# Patient Record
Sex: Male | Born: 1955 | Race: Black or African American | Hispanic: No | Smoking: Former smoker
Health system: Southern US, Community
[De-identification: ages and names within clinical notes are randomized; demographics above are authoritative.]

## PROBLEM LIST (undated history)

## (undated) DIAGNOSIS — Z9189 Other specified personal risk factors, not elsewhere classified: Secondary | ICD-10-CM

## (undated) DIAGNOSIS — F209 Schizophrenia, unspecified: Secondary | ICD-10-CM

## (undated) DIAGNOSIS — H53001 Unspecified amblyopia, right eye: Secondary | ICD-10-CM

## (undated) DIAGNOSIS — J449 Chronic obstructive pulmonary disease, unspecified: Secondary | ICD-10-CM

## (undated) DIAGNOSIS — F410 Panic disorder [episodic paroxysmal anxiety] without agoraphobia: Secondary | ICD-10-CM

## (undated) DIAGNOSIS — H5440 Blindness, one eye, unspecified eye: Secondary | ICD-10-CM

## (undated) HISTORY — PX: TRACHEOSTOMY: SUR1362

## (undated) HISTORY — PX: NECK SURGERY: SHX720

## (undated) HISTORY — DX: Unspecified amblyopia, right eye: H53.001

## (undated) HISTORY — DX: Blindness, one eye, unspecified eye: H54.40

## (undated) HISTORY — PX: EXPLORATORY LAPAROTOMY: SUR591

## (undated) HISTORY — DX: Other specified personal risk factors, not elsewhere classified: Z91.89

---

## 2007-09-10 ENCOUNTER — Encounter: Payer: Self-pay | Admitting: Gastroenterology

## 2007-09-10 ENCOUNTER — Ambulatory Visit: Payer: Self-pay | Admitting: Gastroenterology

## 2007-09-10 ENCOUNTER — Ambulatory Visit (HOSPITAL_COMMUNITY): Admission: RE | Admit: 2007-09-10 | Discharge: 2007-09-10 | Payer: Self-pay | Admitting: Gastroenterology

## 2008-11-12 ENCOUNTER — Ambulatory Visit (HOSPITAL_COMMUNITY): Admission: RE | Admit: 2008-11-12 | Discharge: 2008-11-12 | Payer: Self-pay | Admitting: Internal Medicine

## 2010-06-06 NOTE — Op Note (Signed)
NAME:  Terry [name], Terry [name]                ACCOUNT NO.:  [id number]   MEDICAL RECORD NO.:  [id number]          PATIENT TYPE:  AMB   LOCATION:  DAY                           FACILITY:  [facility]   PHYSICIAN:  Kassie Mends, M.D.      DATE OF BIRTH:  October 10, 1955   DATE OF PROCEDURE:  DATE OF DISCHARGE:                               OPERATIVE REPORT   PRIMARY CARE PHYSICIAN:  Tesfaye D. Felecia Shelling, MD   PROCEDURE:  Colonoscopy with cold forceps polypectomy.   INDICATION FOR EXAM:  Terry [name] is a 55 year old male who presents for  average risk colon cancer screening.   FINDINGS:  1. A 1-mm sessile transverse colon polyp removed via cold forceps.  2. Ascending colon, descending colon, and sigmoid colon      diverticulosis.  Most predominant in the descending and sigmoid      colon.  3. Otherwise, no masses, inflammatory changes, or AVMs seen.  4. Normal retroflexed view of the rectum.   RECOMMENDATIONS:  1. Screening colonoscopy in 10 years.  2. Will call Terry [name] with the results of his biopsies.  3. He should follow a high-fiber diet.  He is given a handout on high-      fiber diet and polyps.  4. No aspirin, NSAIDs, or anticoagulation for 5 days.   MEDICATIONS:  1. Demerol 100 mg IV.  2. Versed 6 mg IV.   PROCEDURE TECHNIQUE:  Physical exam was performed.  Informed consent was  obtained from the patient after explaining the benefits, risks, and  alternatives of the procedure.  The patient was connected to the monitor  and placed in the left lateral position.  Continuous oxygen was provided  by nasal cannula.  IV medicine was administered through an indwelling  cannula.  After administration of sedation and rectal exam, the  patient's rectum was intubated and the scope was advanced under direct  visualization to the cecum.  The scope was removed slowly  by carefully examining the color, texture, anatomy, and integrity of the  mucosa on the way out.  The patient was recovered in endoscopy  and  discharged home in satisfactory condition.   PATH:  Simple adenoma. TCS in 10 years. High Fiber diet.      Kassie Mends, M.D.  Electronically Signed     SM/MEDQ  D:  09/10/2007  T:  09/11/2007  Job:  [id number]   cc:   Tesfaye D. Felecia Shelling, MD  Fax: [phone]

## 2012-06-23 ENCOUNTER — Inpatient Hospital Stay (HOSPITAL_COMMUNITY)
Admission: EM | Admit: 2012-06-23 | Discharge: 2012-06-26 | DRG: 189 | Disposition: A | Discharge status: Home / Self Care | Payer: PRIVATE HEALTH INSURANCE | Attending: Internal Medicine | Admitting: Internal Medicine

## 2012-06-23 ENCOUNTER — Emergency Department (HOSPITAL_COMMUNITY): Payer: PRIVATE HEALTH INSURANCE

## 2012-06-23 ENCOUNTER — Encounter (HOSPITAL_COMMUNITY): Payer: Self-pay | Admitting: *Deleted

## 2012-06-23 DIAGNOSIS — E669 Obesity, unspecified: Secondary | ICD-10-CM | POA: Diagnosis present

## 2012-06-23 DIAGNOSIS — A419 Sepsis, unspecified organism: Secondary | ICD-10-CM

## 2012-06-23 DIAGNOSIS — F209 Schizophrenia, unspecified: Secondary | ICD-10-CM | POA: Diagnosis present

## 2012-06-23 DIAGNOSIS — R7309 Other abnormal glucose: Secondary | ICD-10-CM | POA: Diagnosis present

## 2012-06-23 DIAGNOSIS — Z87891 Personal history of nicotine dependence: Secondary | ICD-10-CM

## 2012-06-23 DIAGNOSIS — F319 Bipolar disorder, unspecified: Secondary | ICD-10-CM | POA: Diagnosis present

## 2012-06-23 DIAGNOSIS — Z833 Family history of diabetes mellitus: Secondary | ICD-10-CM

## 2012-06-23 DIAGNOSIS — E860 Dehydration: Secondary | ICD-10-CM | POA: Diagnosis present

## 2012-06-23 DIAGNOSIS — J441 Chronic obstructive pulmonary disease with (acute) exacerbation: Secondary | ICD-10-CM | POA: Diagnosis present

## 2012-06-23 DIAGNOSIS — Z6841 Body Mass Index (BMI) 40.0 and over, adult: Secondary | ICD-10-CM

## 2012-06-23 DIAGNOSIS — J9600 Acute respiratory failure, unspecified whether with hypoxia or hypercapnia: Principal | ICD-10-CM | POA: Diagnosis present

## 2012-06-23 DIAGNOSIS — E872 Acidosis, unspecified: Secondary | ICD-10-CM | POA: Diagnosis present

## 2012-06-23 HISTORY — DX: Schizophrenia, unspecified: F20.9

## 2012-06-23 LAB — HEPATIC FUNCTION PANEL
ALT: 38 U/L (ref 0–53)
AST: 38 U/L — ABNORMAL HIGH (ref 0–37)
Albumin: 3.2 g/dL — ABNORMAL LOW (ref 3.5–5.2)
Bilirubin, Direct: <0.1 mg/dL (ref 0.0–0.3)
Total Bilirubin: 0.4 mg/dL (ref 0.3–1.2)

## 2012-06-23 LAB — RAPID URINE DRUG SCREEN, HOSP PERFORMED
Amphetamines: NOT DETECTED
Barbiturates: NOT DETECTED
Cocaine: NOT DETECTED
Tetrahydrocannabinol: NOT DETECTED

## 2012-06-23 LAB — CBC WITH DIFFERENTIAL/PLATELET
Eosinophils Absolute: 0.3 10*3/uL (ref 0.0–0.7)
Lymphocytes Relative: 44 % (ref 12–46)
MCHC: 33.1 g/dL (ref 30.0–36.0)
Platelets: 187 10*3/uL (ref 150–400)
RDW: 15.1 % (ref 11.5–15.5)
WBC: 5.6 10*3/uL (ref 4.0–10.5)

## 2012-06-23 LAB — BLOOD GAS, ARTERIAL
Acid-base deficit: 2.9 mmol/L — ABNORMAL HIGH (ref 0.0–2.0)
Bicarbonate: 21.7 mEq/L (ref 20.0–24.0)
O2 Content: 2 L/min
O2 Saturation: 97 %
Patient temperature: 37

## 2012-06-23 LAB — URINALYSIS, ROUTINE W REFLEX MICROSCOPIC
Ketones, ur: NEGATIVE mg/dL
Leukocytes, UA: NEGATIVE
Protein, ur: 100 mg/dL — AB
Specific Gravity, Urine: >1.03 — ABNORMAL HIGH (ref 1.005–1.030)
Urobilinogen, UA: 0.2 mg/dL (ref 0.0–1.0)

## 2012-06-23 LAB — LACTIC ACID, PLASMA: Lactic Acid, Venous: 6.2 mmol/L — ABNORMAL HIGH (ref 0.5–2.2)

## 2012-06-23 LAB — GLUCOSE, CAPILLARY: Glucose-Capillary: 164 mg/dL — ABNORMAL HIGH (ref 70–99)

## 2012-06-23 LAB — BASIC METABOLIC PANEL
BUN: 12 mg/dL (ref 6–23)
CO2: 20 mEq/L (ref 19–32)
Chloride: 99 mEq/L (ref 96–112)
GFR calc non Af Amer: 55 mL/min — ABNORMAL LOW (ref >90)
Potassium: 4 mEq/L (ref 3.5–5.1)

## 2012-06-23 LAB — URINE MICROSCOPIC-ADD ON

## 2012-06-23 MED ORDER — SODIUM CHLORIDE 0.9 % IJ SOLN
3.0000 mL | Freq: Two times a day (BID) | INTRAMUSCULAR | Status: DC
  Administered 2012-06-23 – 2012-06-25 (×6): 3 mL via INTRAVENOUS

## 2012-06-23 MED ORDER — ACETAMINOPHEN 325 MG PO TABS: 650.0000 mg | ORAL_TABLET | ORAL | Status: DC | PRN

## 2012-06-23 MED ORDER — ZIPRASIDONE HCL 80 MG PO CAPS
80.0000 mg | ORAL_CAPSULE | Freq: Every day | ORAL | Status: DC
  Administered 2012-06-23 – 2012-06-25 (×3): 80 mg via ORAL
  Filled 2012-06-23 (×4): qty 1

## 2012-06-23 MED ORDER — SODIUM CHLORIDE 0.9 % IV SOLN
Freq: Once | INTRAVENOUS | Status: AC
  Administered 2012-06-23: 1000 mL via INTRAVENOUS

## 2012-06-23 MED ORDER — BISACODYL 10 MG RE SUPP: 10.0000 mg | Freq: Every day | RECTAL | Status: DC | PRN

## 2012-06-23 MED ORDER — PIPERACILLIN-TAZOBACTAM 3.375 G IVPB
3.3750 g | Freq: Once | INTRAVENOUS | Status: AC
  Administered 2012-06-23: 3.375 g via INTRAVENOUS
  Filled 2012-06-23: qty 50

## 2012-06-23 MED ORDER — BENZTROPINE MESYLATE 1 MG PO TABS
1.0000 mg | ORAL_TABLET | Freq: Every day | ORAL | Status: DC
  Administered 2012-06-23 – 2012-06-25 (×3): 1 mg via ORAL
  Filled 2012-06-23 (×3): qty 1

## 2012-06-23 MED ORDER — ASPIRIN EC 81 MG PO TBEC
81.0000 mg | DELAYED_RELEASE_TABLET | Freq: Every day | ORAL | Status: DC
  Administered 2012-06-23 – 2012-06-26 (×4): 81 mg via ORAL
  Filled 2012-06-23 (×6): qty 1

## 2012-06-23 MED ORDER — POTASSIUM CHLORIDE IN NACL 20-0.9 MEQ/L-% IV SOLN
INTRAVENOUS | Status: DC
  Administered 2012-06-23 – 2012-06-26 (×7): via INTRAVENOUS

## 2012-06-23 MED ORDER — SODIUM CHLORIDE 0.9 % IV BOLUS (SEPSIS)
1000.0000 mL | Freq: Once | INTRAVENOUS | Status: AC
  Administered 2012-06-23: 1000 mL via INTRAVENOUS

## 2012-06-23 MED ORDER — SODIUM CHLORIDE 0.9 % IV BOLUS (SEPSIS): 1000.0000 mL | Freq: Once | INTRAVENOUS | Status: DC

## 2012-06-23 MED ORDER — ALBUTEROL SULFATE (5 MG/ML) 0.5% IN NEBU
5.0000 mg | INHALATION_SOLUTION | Freq: Once | RESPIRATORY_TRACT | Status: AC
  Administered 2012-06-23: 5 mg via RESPIRATORY_TRACT
  Filled 2012-06-23: qty 1

## 2012-06-23 MED ORDER — GUAIFENESIN ER 600 MG PO TB12
1200.0000 mg | ORAL_TABLET | Freq: Two times a day (BID) | ORAL | Status: DC
  Administered 2012-06-23 – 2012-06-26 (×7): 1200 mg via ORAL
  Filled 2012-06-23 (×10): qty 2

## 2012-06-23 MED ORDER — CHLORHEXIDINE GLUCONATE CLOTH 2 % EX PADS: 6.0000 | MEDICATED_PAD | Freq: Once | CUTANEOUS | Status: DC

## 2012-06-23 MED ORDER — IPRATROPIUM BROMIDE 0.02 % IN SOLN
0.5000 mg | RESPIRATORY_TRACT | Status: DC
  Administered 2012-06-23 – 2012-06-26 (×15): 0.5 mg via RESPIRATORY_TRACT
  Filled 2012-06-23 (×15): qty 2.5

## 2012-06-23 MED ORDER — FLEET ENEMA 7-19 GM/118ML RE ENEM: 1.0000 | ENEMA | Freq: Once | RECTAL | Status: AC | PRN

## 2012-06-23 MED ORDER — ENOXAPARIN SODIUM 40 MG/0.4ML ~~LOC~~ SOLN
40.0000 mg | SUBCUTANEOUS | Status: DC
  Administered 2012-06-24 – 2012-06-26 (×3): 40 mg via SUBCUTANEOUS
  Filled 2012-06-23 (×3): qty 0.4

## 2012-06-23 MED ORDER — ALBUTEROL SULFATE (5 MG/ML) 0.5% IN NEBU
2.5000 mg | INHALATION_SOLUTION | RESPIRATORY_TRACT | Status: DC
  Administered 2012-06-23 – 2012-06-26 (×15): 2.5 mg via RESPIRATORY_TRACT
  Filled 2012-06-23 (×15): qty 0.5

## 2012-06-23 MED ORDER — LEVOFLOXACIN IN D5W 750 MG/150ML IV SOLN
750.0000 mg | INTRAVENOUS | Status: AC
  Administered 2012-06-24 – 2012-06-25 (×2): 750 mg via INTRAVENOUS
  Filled 2012-06-23 (×2): qty 150

## 2012-06-23 MED ORDER — INSULIN ASPART 100 UNIT/ML ~~LOC~~ SOLN: 0.0000 [IU] | Freq: Every day | SUBCUTANEOUS | Status: DC

## 2012-06-23 MED ORDER — ALBUTEROL SULFATE (5 MG/ML) 0.5% IN NEBU: 2.5000 mg | INHALATION_SOLUTION | RESPIRATORY_TRACT | Status: DC | PRN

## 2012-06-23 MED ORDER — METHYLPREDNISOLONE SODIUM SUCC 125 MG IJ SOLR
125.0000 mg | Freq: Four times a day (QID) | INTRAMUSCULAR | Status: DC
  Administered 2012-06-23 – 2012-06-25 (×8): 125 mg via INTRAVENOUS
  Filled 2012-06-23 (×8): qty 2

## 2012-06-23 MED ORDER — INSULIN ASPART 100 UNIT/ML ~~LOC~~ SOLN
0.0000 [IU] | Freq: Three times a day (TID) | SUBCUTANEOUS | Status: DC
  Administered 2012-06-23 – 2012-06-24 (×5): 3 [IU] via SUBCUTANEOUS
  Administered 2012-06-25 (×2): 2 [IU] via SUBCUTANEOUS
  Administered 2012-06-25: 3 [IU] via SUBCUTANEOUS

## 2012-06-23 MED ORDER — IPRATROPIUM BROMIDE 0.02 % IN SOLN
0.5000 mg | Freq: Once | RESPIRATORY_TRACT | Status: AC
  Administered 2012-06-23: 0.5 mg via RESPIRATORY_TRACT
  Filled 2012-06-23: qty 2.5

## 2012-06-23 MED ORDER — ONDANSETRON HCL 4 MG/2ML IJ SOLN: 4.0000 mg | INTRAMUSCULAR | Status: DC | PRN

## 2012-06-23 NOTE — Progress Notes (Signed)
Inpatient Diabetes Program Recommendations  AACE/ADA: New Consensus Statement on Inpatient Glycemic Control (2013)  Target Ranges:  Prepandial:   less than 140 mg/dL      Peak postprandial:   less than 180 mg/dL (1-2 hours)      Critically ill patients:  140 - 180 mg/dL   Results for Terry [name], Terry [name] (MRN [id number]) as of 06/23/2012 07:36  Ref. Range 06/23/2012 03:05  Glucose Latest Range: 70-99 mg/dL 782 (H)    Inpatient Diabetes Program Recommendations Correction (SSI): Please consider ordering CBGs with Novolog correction ACHS. HgbA1C: Please consider ordering an A1C to determing glycemic control over the past 2-3 months. Diet: May want to consider changing diet to carb modified diabetic diet.  Note: Patient does not have a documented history of diabetes.  However, initial lab glucose was 223 mg/dl at 9:56 am on 02/23/28.  Please consider ordering an A1C, CBGs with Novolog correction, and changing diet to carb modified.  Will continue to follow.  Thanks, Orlando Penner, RN, MSN, CCRN Diabetes Coordinator Inpatient Diabetes Program [phone]

## 2012-06-23 NOTE — Progress Notes (Signed)
Brief initial visit offering emotional/spiritual support.  Patient expressed he had his faith support contact.

## 2012-06-23 NOTE — H&P (Signed)
[facility] History and Physical  Terry [name]  ZOX:[id number]  DOB: 1955/07/19   DOA: 06/23/2012   PCP:   Avon Gully, MD   Chief Complaint:  Respiratory distress  HPI: Terry [name] is an 57 y.o. male.   Obese middle-aged [place] American gentleman with a history of schizophrenia and COPD, reports that she's been having increasing shortness of breath for the past month which she has been treating using his home nebulizer. During the other about attack today he kept using his nebulizer repeatedly without any effect and slices are become fatigued and confused; he said he felt as though his nebulizer was not working and he wondered whether the medication was expired. Eventually his mother called EMS who found him barely responsive with an O2 sats in the 50s. They gave him nebulization which brought it into the 90s and he was transported to the emergency room.  In the emergency room patient was noted to be dehydrated, hypotensive with blood pressure in the 80s, and was noticed to have a lactic acid of 6.2. The hospitalist service was called to assess.  He is has no fever, no worsening cough, no frequency or dysuria.  Rewiew of Systems:   All systems negative except as marked bold or noted in the HPI;  Constitutional:    malaise, fever and chills. ;  Eyes:   eye pain, redness and discharge. ;  ENMT:   ear pain, hoarseness, nasal congestion, sinus pressure and sore throat. ;  Cardiovascular:    chest pain, palpitations, diaphoresis, dyspnea and peripheral edema.  Respiratory:   cough, hemoptysis, wheezing and stridor. ;  Gastrointestinal:  nausea, vomiting, diarrhea, constipation, abdominal pain, melena, blood in stool, hematemesis, jaundice and rectal bleeding. unusual weight loss..   Genitourinary:    frequency, dysuria, incontinence,flank pain and hematuria; Musculoskeletal:   back pain and neck pain.  swelling and trauma.;  Skin: .  pruritus, rash, abrasions, bruising and skin  lesion.; ulcerations Neuro:    headache, lightheadedness and neck stiffness.  weakness, altered level of consciousness, altered mental status, extremity weakness, burning feet, involuntary movement, seizure and syncope.  Psych:    anxiety, depression, insomnia, tearfulness, panic attacks, hallucinations, paranoia, suicidal or homicidal ideation    Past Medical History  Diagnosis Date  . Bronchitis   . Bipolar disorder     Past Surgical History  Procedure Laterality Date  . Neck surgery      Medications:  HOME MEDS: Prior to Admission medications   Medication Sig Start Date End Date Taking? Authorizing Provider  benztropine (COGENTIN) 1 MG tablet Take 1 mg by mouth at bedtime.   Yes Historical Provider, MD  ziprasidone (GEODON) 80 MG capsule Take 80 mg by mouth at bedtime.   Yes Historical Provider, MD     Allergies:  No Known Allergies  Social History:   reports that he has quit smoking. He does not have any smokeless tobacco history on file. He reports that he does not drink alcohol or use illicit drugs.  Family History: His father was diabetic  Physical Exam: Filed Vitals:   06/23/12 0341 06/23/12 0406 06/23/12 0500 06/23/12 0600  BP: 80/49 92/48 90/55  100/52  Pulse: 105 98 90 94  Temp:      TempSrc:      Resp:  22 18 21   Height:      Weight:      SpO2: 95% 94%     Blood pressure 100/52, pulse 94, temperature 98.3 F (36.8 C), temperature source  Oral, resp. rate 21, height 5\' 8"  (1.727 m), weight 120.203 kg (265 lb), SpO2 94.00%.  GEN:  Pleasant obese middle-aged African American gentleman reclining in the stretcher in no acute distress; cooperative with exam PSYCH:  alert and oriented x4;  neither anxious nor depressed; affect is appropriate. HEENT: Mucous membranes pink and anicteric; PERRLA; EOM intact; no cervical lymphadenopathy nor thyromegaly or carotid bruit; no JVD; thick neck Breasts:: Not examined CHEST WALL: No tenderness CHEST: Tachypneic, diffuse  mild bilateral wheezing HEART: Regular rate and rhythm; no murmurs rubs or gallops BACK: No kyphosis no scoliosis; no CVA tenderness ABDOMEN: Obese, firm  non-tender; no masses, no organomegaly, normal abdominal bowel sounds;  Rectal Exam: Not done EXTREMITIES: age-appropriate arthropathy of the hands and knees; no edema; no ulcerations. Genitalia: not examined PULSES: 2+ and symmetric SKIN: Normal hydration no rash or ulceration CNS: Cranial nerves 2-12 grossly intact no focal lateralizing neurologic deficit   Labs on Admission:  Basic Metabolic Panel:  Recent Labs Lab 06/23/12 0305  NA 136  K 4.0  CL 99  CO2 20  GLUCOSE 223*  BUN 12  CREATININE 1.40*  CALCIUM 8.8   Liver Function Tests: No results found for this basename: AST, ALT, ALKPHOS, BILITOT, PROT, ALBUMIN,  in the last 168 hours No results found for this basename: LIPASE, AMYLASE,  in the last 168 hours No results found for this basename: AMMONIA,  in the last 168 hours CBC:  Recent Labs Lab 06/23/12 0305  WBC 5.6  NEUTROABS 2.5  HGB 14.8  HCT 44.7  MCV 81.7  PLT 187   Cardiac Enzymes: No results found for this basename: CKTOTAL, CKMB, CKMBINDEX, TROPONINI,  in the last 168 hours BNP: No components found with this basename: POCBNP,  D-dimer: No components found with this basename: D-DIMER,  CBG: No results found for this basename: GLUCAP,  in the last 168 hours  Radiological Exams on Admission: Dg Chest Portable 1 View  06/23/2012   *RADIOLOGY REPORT*  Clinical Data: Shortness of breath; history of asthma.  PORTABLE CHEST - 1 VIEW  Comparison: Chest radiograph performed 11/12/2008  Findings: The lungs are well-aerated and clear.  There is no evidence of focal opacification, pleural effusion or pneumothorax.  The cardiomediastinal silhouette is within normal limits.  No acute osseous abnormalities are seen.  IMPRESSION: No acute cardiopulmonary process seen.   Original Report Authenticated By: Tonia Ghent, M.D.       Assessment/Plan  Principal Problem:   Lactic acidosis Active Problems:   Dehydration   COPD with acute exacerbation   Schizophrenia, well controlled  hyperglycemia possible diabetic   PLAN: Lactic acidosis likely due to the excess work of breathing associated with hypoxia; unlikely to be due to sepsis. Nevertheless we'll admit him to the step down unit for hydration and monitoring of his vital signs in case this is an early sepsis.  Give steroids nebulizations IV fluids and a short course of antibiotics for COPD exacerbation  We'll check his hemoglobin A1c place him on a diabetic diet and give sliding scale insulin. If hemoglobin A1c is elevated we'll need treatment for his diabetes including weight loss  Continue Geodon and Cogentin for tonic schizophrenia  Other plans as per orders.  Code Status: Full code Family Communication: Plans discussed with patient Disposition Plan: Eventually back home  Critical care time: 60 minutes.   Terry [name] Nocturnist [facility] Pager [phone]   06/23/2012, 6:15 AM

## 2012-06-23 NOTE — ED Notes (Signed)
Pt states he got were he could not breath, pt was trying to use his neb machine, but did not help. Pt thinks medicine is old saying it is a least a year old.

## 2012-06-23 NOTE — Progress Notes (Signed)
Utilization Review Complete  

## 2012-06-23 NOTE — ED Notes (Signed)
EMS called out for unresponsive, upon arrival pt sats in the 50's, pt trying to take breathing tx. EMS gave breathing tx, pt now alert & answering questions pt states could not breath right, started while working on computer.

## 2012-06-23 NOTE — ED Provider Notes (Signed)
History     CSN: [id number]  Arrival date & time 06/23/12  0246   First MD Initiated Contact with Patient 06/23/12 0257      Chief Complaint  Patient presents with  . Shortness of Breath    (Consider location/radiation/quality/duration/timing/severity/associated sxs/prior treatment) HPI HPI Comments: Terry [name] is a 57 y.o. male brought in by ambulance, who presents to the Emergency Department complaining of shortness of breath. When found by EMS he was on a nebulizer, O2 sats were in the 50s, he was barely responsive. They gave him a breathing treatment with revival of the patient and O2 sats in the 90s. He states he was working on the computer and got short of breath. He went to use his nebulizer and that the last thing he remembers clearly until EMS was there. His wife says he was barely responsive on the nebulizer when she called EMS.   PCP Dr. Felecia Shelling Past Medical History  Diagnosis Date  . Bronchitis   . Bipolar disorder     Past Surgical History  Procedure Laterality Date  . Neck surgery      No family history on file.  History  Substance Use Topics  . Smoking status: Former Games developer  . Smokeless tobacco: Not on file  . Alcohol Use: No      Review of Systems  Constitutional: Negative for fever.       10 Systems reviewed and are negative for acute change except as noted in the HPI.  HENT: Negative for congestion.   Eyes: Negative for discharge and redness.  Respiratory: Positive for shortness of breath. Negative for cough.   Cardiovascular: Negative for chest pain.  Gastrointestinal: Negative for vomiting and abdominal pain.  Musculoskeletal: Negative for back pain.  Skin: Negative for rash.  Neurological: Negative for syncope, numbness and headaches.  Psychiatric/Behavioral:       No behavior change.    Allergies  Review of patient's allergies indicates no known allergies.  Home Medications   Current Outpatient Rx  Name  Route  Sig  Dispense   Refill  . benztropine (COGENTIN) 1 MG tablet   Oral   Take 1 mg by mouth at bedtime.         . ziprasidone (GEODON) 80 MG capsule   Oral   Take 80 mg by mouth at bedtime.           BP 90/55  Pulse 90  Temp(Src) 98.3 F (36.8 C) (Oral)  Resp 18  Ht 5\' 8"  (1.727 m)  Wt 265 lb (120.203 kg)  BMI 40.3 kg/m2  SpO2 94%  Physical Exam  Nursing note and vitals reviewed. Constitutional: He appears well-developed and well-nourished.  Awake, alert, nontoxic appearance.  HENT:  Head: Normocephalic and atraumatic.  Right Ear: External ear normal.  Left Ear: External ear normal.  Eyes: EOM are normal. Pupils are equal, round, and reactive to light.  Neck: Normal range of motion. Neck supple.  Cardiovascular: Intact distal pulses.   tachycardia  Pulmonary/Chest: Effort normal. He exhibits no tenderness.  Wheezing, moderate air movement.  Abdominal: Soft. There is no tenderness. There is no rebound.  Musculoskeletal: He exhibits no tenderness.  Baseline ROM, no obvious new focal weakness.  Neurological:  Mental status and motor strength appears baseline for patient and situation.  Skin: No rash noted.  Psychiatric: He has a normal mood and affect.    ED Course  Procedures (including critical care time) Results for orders placed during the hospital encounter of  06/23/12  CBC WITH DIFFERENTIAL      Result Value Range   WBC 5.6  4.0 - 10.5 K/uL   RBC 5.47  4.22 - 5.81 MIL/uL   Hemoglobin 14.8  13.0 - 17.0 g/dL   HCT 16.1  09.6 - 04.5 %   MCV 81.7  78.0 - 100.0 fL   MCH 27.1  26.0 - 34.0 pg   MCHC 33.1  30.0 - 36.0 g/dL   RDW 40.9  81.1 - 91.4 %   Platelets 187  150 - 400 K/uL   Neutrophils Relative % 46  43 - 77 %   Lymphocytes Relative 44  12 - 46 %   Monocytes Relative 3  3 - 12 %   Eosinophils Relative 6 (*) 0 - 5 %   Basophils Relative 1  0 - 1 %   Neutro Abs 2.5  1.7 - 7.7 K/uL   Lymphs Abs 2.5  0.7 - 4.0 K/uL   Monocytes Absolute 0.2  0.1 - 1.0 K/uL    Eosinophils Absolute 0.3  0.0 - 0.7 K/uL   Basophils Absolute 0.1  0.0 - 0.1 K/uL   WBC Morphology ATYPICAL LYMPHOCYTES    BASIC METABOLIC PANEL      Result Value Range   Sodium 136  135 - 145 mEq/L   Potassium 4.0  3.5 - 5.1 mEq/L   Chloride 99  96 - 112 mEq/L   CO2 20  19 - 32 mEq/L   Glucose, Bld 223 (*) 70 - 99 mg/dL   BUN 12  6 - 23 mg/dL   Creatinine, Ser 7.82 (*) 0.50 - 1.35 mg/dL   Calcium 8.8  8.4 - 95.6 mg/dL   GFR calc non Af Amer 55 (*) >90 mL/min   GFR calc Af Amer 63 (*) >90 mL/min  LACTIC ACID, PLASMA      Result Value Range   Lactic Acid, Venous 6.2 (*) 0.5 - 2.2 mmol/L  URINALYSIS, ROUTINE W REFLEX MICROSCOPIC      Result Value Range   Color, Urine YELLOW  YELLOW   APPearance CLEAR  CLEAR   Specific Gravity, Urine >1.030 (*) 1.005 - 1.030   pH 5.5  5.0 - 8.0   Glucose, UA 100 (*) NEGATIVE mg/dL   Hgb urine dipstick MODERATE (*) NEGATIVE   Bilirubin Urine NEGATIVE  NEGATIVE   Ketones, ur NEGATIVE  NEGATIVE mg/dL   Protein, ur 213 (*) NEGATIVE mg/dL   Urobilinogen, UA 0.2  0.0 - 1.0 mg/dL   Nitrite NEGATIVE  NEGATIVE   Leukocytes, UA NEGATIVE  NEGATIVE  URINE RAPID DRUG SCREEN (HOSP PERFORMED)      Result Value Range   Opiates NONE DETECTED  NONE DETECTED   Cocaine NONE DETECTED  NONE DETECTED   Benzodiazepines NONE DETECTED  NONE DETECTED   Amphetamines NONE DETECTED  NONE DETECTED   Tetrahydrocannabinol NONE DETECTED  NONE DETECTED   Barbiturates NONE DETECTED  NONE DETECTED  URINE MICROSCOPIC-ADD ON      Result Value Range   RBC / HPF 3-6  <3 RBC/hpf   Casts GRANULAR CAST (*) NEGATIVE     Date: 06/23/2012   0865  Rate: 122  Rhythm: sinus tachycardia  QRS Axis: normal  Intervals: normal  ST/T Wave abnormalities: normal  Conduction Disutrbances:none  Narrative Interpretation:   Old EKG Reviewed: none available Dg Chest Portable 1 View  06/23/2012   *RADIOLOGY REPORT*  Clinical Data: Shortness of breath; history of asthma.  PORTABLE CHEST - 1 VIEW  Comparison: Chest radiograph performed 11/12/2008  Findings: The lungs are well-aerated and clear.  There is no evidence of focal opacification, pleural effusion or pneumothorax.  The cardiomediastinal silhouette is within normal limits.  No acute osseous abnormalities are seen.  IMPRESSION: No acute cardiopulmonary process seen.   Original Report Authenticated By: Tonia Ghent, M.D.   6:05 AM:  T/C to Dr. Modena Nunnery, hospitalist, case discussed, including:  HPI, pertinent PM/SHx, VS/PE, dx testing, ED course and treatment.  Asked that I call him back once UA is back.  6:09 AM:  T/C to Dr. Orvan Falconer, hospitalist. Jovita Gamma him the UA results. With urine only showing dehydration not clear the cause of elevated lactic acid. Will continue fluid resuscitation.Admit to step down to Dr. Felecia Shelling.    MDM  Patient presents with shortness of breath that was resolving as he arrived having gotten albuterol/atrovent en route. His O2 sats at home were in the 50s and now in the 90s. He feels good. His blood pressure is low and he is receiving fluid resuscitation. Lactic acid is high without evidence of other markers for sepsis. Antibiotics were begun. Spoke with Dr. Orvan Falconer who will admit him to the stepdown unit. Will continue fluid resuscitation. Pt stable in ED with no significant deterioration in condition.The patient appears reasonably stabilized for admission considering the current resources, flow, and capabilities available in the ED at this time, and I doubt any other [facility] requiring further screening and/or treatment in the ED prior to admission.  MDM Reviewed: nursing note and vitals Interpretation: labs, ECG and x-ray           Nicoletta Dress. Colon Branch, MD 06/23/12 [id number]

## 2012-06-23 NOTE — Consult Note (Signed)
Consult requested by: Dr. Felecia Shelling Consult requested for respiratory failure:  HPI: This is a 57 year old who who has a significant history of COPD and schizophrenia. He has been having increasing problems with his breathing over the last month. He has been taking nebulizer treatments but says it did not help. He eventually got bad enough that EMS was called and he was found to be barely responsive with oxygen saturation in the 50s. On admission he was hypotensive dehydrated and had a lactate level of 6.2. He says he feels better. He has no new complaints. He denies abdominal pain. He denies chest pain. He says he is still coughing and sometimes when he coughs it makes him very short of breath  Past Medical History  Diagnosis Date  . Bronchitis   . Schizophrenia      Family History  Problem Relation Age of Onset  . Diabetes Father      History   Social History  . Marital Status: Widowed    Spouse Name: N/A    Number of Children: N/A  . Years of Education: N/A   Social History Main Topics  . Smoking status: Former Games developer  . Smokeless tobacco: None  . Alcohol Use: No  . Drug Use: No  . Sexually Active: None   Other Topics Concern  . None   Social History Narrative  . None     ROS: He has been having some feeling of chills. He denies chest pain hemoptysis nausea vomiting or edema    Objective: Vital signs in last 24 hours: Temp:  [97.7 F (36.5 C)-98.3 F (36.8 C)] 98.3 F (36.8 C) (06/02 0759) Pulse Rate:  [90-127] 94 (06/02 0600) Resp:  [18-35] 21 (06/02 0600) BP: (80-110)/(48-69) 100/52 mmHg (06/02 0600) SpO2:  [93 %-96 %] 96 % (06/02 0722) Weight:  [120.203 kg (265 lb)-121.9 kg (268 lb 11.9 oz)] 121.9 kg (268 lb 11.9 oz) (06/02 0648) Weight change:     Intake/Output from previous day: 06/01 0701 - 06/02 0700 In: 3000 [I.V.:3000] Out: -   PHYSICAL EXAM He is awake and alert. He has anisocoric gaze. He has multiple missing teeth. His neck is supple without  masses JVD or bruits. His chest shows rhonchi bilaterally. His heart is regular. Abdomen is soft there's no tenderness no masses bowel sounds present and active in his extremities showed no edema. Central nervous system exam is grossly intact  Lab Results: Basic Metabolic Panel:  Recent Labs  91/47/82 0305  NA 136  K 4.0  CL 99  CO2 20  GLUCOSE 223*  BUN 12  CREATININE 1.40*  CALCIUM 8.8   Liver Function Tests:  Recent Labs  06/23/12 0628  AST 38*  ALT 38  ALKPHOS 128*  BILITOT 0.4  PROT 7.5  ALBUMIN 3.2*   No results found for this basename: LIPASE, AMYLASE,  in the last 72 hours No results found for this basename: AMMONIA,  in the last 72 hours CBC:  Recent Labs  06/23/12 0305  WBC 5.6  NEUTROABS 2.5  HGB 14.8  HCT 44.7  MCV 81.7  PLT 187   Cardiac Enzymes: No results found for this basename: CKTOTAL, CKMB, CKMBINDEX, TROPONINI,  in the last 72 hours BNP: No results found for this basename: PROBNP,  in the last 72 hours D-Dimer: No results found for this basename: DDIMER,  in the last 72 hours CBG:  Recent Labs  06/23/12 0807  GLUCAP 173*   Hemoglobin A1C: No results found for this basename:  HGBA1C,  in the last 72 hours Fasting Lipid Panel: No results found for this basename: CHOL, HDL, LDLCALC, TRIG, CHOLHDL, LDLDIRECT,  in the last 72 hours Thyroid Function Tests: No results found for this basename: TSH, T4TOTAL, FREET4, T3FREE, THYROIDAB,  in the last 72 hours Anemia Panel: No results found for this basename: VITAMINB12, FOLATE, FERRITIN, TIBC, IRON, RETICCTPCT,  in the last 72 hours Coagulation: No results found for this basename: LABPROT, INR,  in the last 72 hours Urine Drug Screen: Drugs of Abuse     Component Value Date/Time   LABOPIA NONE DETECTED 06/23/2012 0531   COCAINSCRNUR NONE DETECTED 06/23/2012 0531   LABBENZ NONE DETECTED 06/23/2012 0531   AMPHETMU NONE DETECTED 06/23/2012 0531   THCU NONE DETECTED 06/23/2012 0531   LABBARB NONE  DETECTED 06/23/2012 0531    Alcohol Level: No results found for this basename: ETH,  in the last 72 hours Urinalysis:  Recent Labs  06/23/12 0531  COLORURINE YELLOW  LABSPEC >1.030*  PHURINE 5.5  GLUCOSEU 100*  HGBUR MODERATE*  BILIRUBINUR NEGATIVE  KETONESUR NEGATIVE  PROTEINUR 100*  UROBILINOGEN 0.2  NITRITE NEGATIVE  LEUKOCYTESUR NEGATIVE   Misc. Labs:   ABGS: No results found for this basename: PHART, PCO2, PO2ART, TCO2, HCO3,  in the last 72 hours   MICROBIOLOGY: No results found for this or any previous visit (from the past 240 hour(s)).  Studies/Results: Dg Chest Portable 1 View  06/23/2012   *RADIOLOGY REPORT*  Clinical Data: Shortness of breath; history of asthma.  PORTABLE CHEST - 1 VIEW  Comparison: Chest radiograph performed 11/12/2008  Findings: The lungs are well-aerated and clear.  There is no evidence of focal opacification, pleural effusion or pneumothorax.  The cardiomediastinal silhouette is within normal limits.  No acute osseous abnormalities are seen.  IMPRESSION: No acute cardiopulmonary process seen.   Original Report Authenticated By: Tonia Ghent, M.D.    Medications:  Scheduled: . albuterol  2.5 mg Nebulization Q4H WA  . aspirin EC  81 mg Oral Daily  . benztropine  1 mg Oral QHS  . Chlorhexidine Gluconate Cloth  6 each Topical Once  . enoxaparin (LOVENOX) injection  40 mg Subcutaneous Q24H  . guaiFENesin  1,200 mg Oral BID  . insulin aspart  0-15 Units Subcutaneous TID WC  . insulin aspart  0-5 Units Subcutaneous QHS  . ipratropium  0.5 mg Nebulization Q4H WA  . levofloxacin (LEVAQUIN) IV  750 mg Intravenous Q24H  . methylPREDNISolone (SOLU-MEDROL) injection  125 mg Intravenous Q6H  . sodium chloride  1,000 mL Intravenous Once  . sodium chloride  3 mL Intravenous Q12H  . ziprasidone  80 mg Oral QHS   Continuous: . 0.9 % NaCl with KCl 20 mEq / L     YNW:[id number], albuterol, bisacodyl, ondansetron (ZOFRAN) IV, sodium  phosphate  Assesment: He has COPD with exacerbation. He was dehydrated on admission. Lactate level was elevated. He does not look clinically septic now Principal Problem:   Lactic acidosis Active Problems:   Dehydration   COPD with acute exacerbation   Schizophrenia    Plan: Repeat lactate level. Continue with current antibiotics and steroids. He does seem to have improved.  Thanks for allow me to see him with you    LOS: 0 days   Milferd Ansell L 06/23/2012, 8:12 AM

## 2012-06-24 LAB — GLUCOSE, CAPILLARY
Glucose-Capillary: 163 mg/dL — ABNORMAL HIGH (ref 70–99)
Glucose-Capillary: 171 mg/dL — ABNORMAL HIGH (ref 70–99)

## 2012-06-24 LAB — CBC
Hemoglobin: 14.6 g/dL (ref 13.0–17.0)
MCH: 27.1 pg (ref 26.0–34.0)
Platelets: 199 10*3/uL (ref 150–400)
RBC: 5.38 MIL/uL (ref 4.22–5.81)
WBC: 12.9 10*3/uL — ABNORMAL HIGH (ref 4.0–10.5)

## 2012-06-24 LAB — BASIC METABOLIC PANEL
GFR calc non Af Amer: 78 mL/min — ABNORMAL LOW (ref >90)
Glucose, Bld: 176 mg/dL — ABNORMAL HIGH (ref 70–99)
Potassium: 4.5 mEq/L (ref 3.5–5.1)
Sodium: 134 mEq/L — ABNORMAL LOW (ref 135–145)

## 2012-06-24 NOTE — Progress Notes (Signed)
Subjective: Patient was admitted due to respiratory failure and COPD exacerbation. He has improved and feels much better. No chest pain, cough, nausea or vomiting. No fever or chills.   Objective: Vital signs in last 24 hours: Temp:  [97.9 F (36.6 C)-98.7 F (37.1 C)] 97.9 F (36.6 C) (06/03 0730) Pulse Rate:  [71-99] 74 (06/03 0500) Resp:  [17-29] 17 (06/03 0500) BP: (107-138)/(59-80) 133/67 mmHg (06/03 0500) SpO2:  [93 %-100 %] 94 % (06/03 0500) Weight:  [119.3 kg (263 lb 0.1 oz)] 119.3 kg (263 lb 0.1 oz) (06/03 0500) Weight change: -0.903 kg (-1 lb 15.9 oz) Last BM Date: 06/22/12  Intake/Output from previous day: 06/02 0701 - 06/03 0700 In: 4139.3 [P.O.:1680; I.V.:2459.3] Out: 4100 [Urine:4100]  PHYSICAL EXAM General appearance: cooperative and no distress Resp: diminished breath sounds bilaterally and rhonchi bilaterally Cardio: S1, S2 normal GI: soft, non-tender; bowel sounds normal; no masses,  no organomegaly Extremities: extremities normal, atraumatic, no cyanosis or edema  Lab Results:    @labtest @ ABGS  Recent Labs  06/23/12 0848  PHART 7.355  PO2ART 91.7  TCO2 19.5  HCO3 21.7   CULTURES Recent Results (from the past 240 hour(s))  CULTURE, BLOOD (ROUTINE X 2)     Status: None   Collection Time    06/23/12  5:16 AM      Result Value Range Status   Specimen Description Blood   Final   Special Requests NONE   Final   Culture NO GROWTH <24 HRS   Final   Report Status PENDING   Incomplete  CULTURE, BLOOD (ROUTINE X 2)     Status: None   Collection Time    06/23/12  5:41 AM      Result Value Range Status   Specimen Description Blood   Final   Special Requests NONE   Final   Culture NO GROWTH <24 HRS   Final   Report Status PENDING   Incomplete  MRSA PCR SCREENING     Status: None   Collection Time    06/23/12  6:48 AM      Result Value Range Status   MRSA by PCR NEGATIVE  NEGATIVE Final   Comment:            The GeneXpert MRSA Assay (FDA   approved for NASAL specimens     only), is one component of a     comprehensive MRSA colonization     surveillance program. It is not     intended to diagnose MRSA     infection nor to guide or     monitor treatment for     MRSA infections.   Studies/Results: Dg Chest Portable 1 View  06/23/2012   *RADIOLOGY REPORT*  Clinical Data: Shortness of breath; history of asthma.  PORTABLE CHEST - 1 VIEW  Comparison: Chest radiograph performed 11/12/2008  Findings: The lungs are well-aerated and clear.  There is no evidence of focal opacification, pleural effusion or pneumothorax.  The cardiomediastinal silhouette is within normal limits.  No acute osseous abnormalities are seen.  IMPRESSION: No acute cardiopulmonary process seen.   Original Report Authenticated By: Tonia Ghent, M.D.    Medications: I have reviewed the patient's current medications.  Assesment: Principal Problem:   Lactic acidosis Active Problems:   Dehydration   COPD with acute exacerbation   Schizophrenia    Plan:  Continue Iv antibiotics and Iv steroid Continue nebulizer treatment Continue regular treatment Pulmonary consult appreciated.    LOS: 1 day  Ethyn Schetter 06/24/2012, 7:53 AM

## 2012-06-24 NOTE — Progress Notes (Signed)
Subjective: He is much improved. He has no new complaints. He had respiratory failure from COPD exacerbation but as mentioned has improved markedly  Objective: Vital signs in last 24 hours: Temp:  [97.9 F (36.6 C)-98.7 F (37.1 C)] 97.9 F (36.6 C) (06/03 0730) Pulse Rate:  [71-97] 74 (06/03 0500) Resp:  [17-29] 17 (06/03 0500) BP: (107-138)/(59-80) 133/67 mmHg (06/03 0500) SpO2:  [93 %-100 %] 96 % (06/03 0807) Weight:  [119.3 kg (263 lb 0.1 oz)] 119.3 kg (263 lb 0.1 oz) (06/03 0500) Weight change: -0.903 kg (-1 lb 15.9 oz) Last BM Date: 06/22/12  Intake/Output from previous day: 06/02 0701 - 06/03 0700 In: 4139.3 [P.O.:1680; I.V.:2459.3] Out: 4100 [Urine:4100]  PHYSICAL EXAM General appearance: alert, cooperative and no distress Resp: clear to auscultation bilaterally Cardio: regular rate and rhythm, S1, S2 normal, no murmur, click, rub or gallop GI: soft, non-tender; bowel sounds normal; no masses,  no organomegaly Extremities: extremities normal, atraumatic, no cyanosis or edema  Lab Results:    Basic Metabolic Panel:  Recent Labs  16/10/96 0305 06/24/12 0433  NA 136 134*  K 4.0 4.5  CL 99 103  CO2 20 22  GLUCOSE 223* 176*  BUN 12 13  CREATININE 1.40* 1.04  CALCIUM 8.8 9.1   Liver Function Tests:  Recent Labs  06/23/12 0628  AST 38*  ALT 38  ALKPHOS 128*  BILITOT 0.4  PROT 7.5  ALBUMIN 3.2*   No results found for this basename: LIPASE, AMYLASE,  in the last 72 hours No results found for this basename: AMMONIA,  in the last 72 hours CBC:  Recent Labs  06/23/12 0305 06/24/12 0433  WBC 5.6 12.9*  NEUTROABS 2.5  --   HGB 14.8 14.6  HCT 44.7 42.6  MCV 81.7 79.2  PLT 187 199   Cardiac Enzymes: No results found for this basename: CKTOTAL, CKMB, CKMBINDEX, TROPONINI,  in the last 72 hours BNP: No results found for this basename: PROBNP,  in the last 72 hours D-Dimer: No results found for this basename: DDIMER,  in the last 72  hours CBG:  Recent Labs  06/23/12 0807 06/23/12 1150 06/23/12 1628 06/23/12 2118 06/24/12 0718  GLUCAP 173* 104* 170* 164* 163*   Hemoglobin A1C:  Recent Labs  06/23/12 0750  HGBA1C 6.0*   Fasting Lipid Panel: No results found for this basename: CHOL, HDL, LDLCALC, TRIG, CHOLHDL, LDLDIRECT,  in the last 72 hours Thyroid Function Tests:  Recent Labs  06/23/12 0628  TSH 2.332   Anemia Panel: No results found for this basename: VITAMINB12, FOLATE, FERRITIN, TIBC, IRON, RETICCTPCT,  in the last 72 hours Coagulation: No results found for this basename: LABPROT, INR,  in the last 72 hours Urine Drug Screen: Drugs of Abuse     Component Value Date/Time   LABOPIA NONE DETECTED 06/23/2012 0531   COCAINSCRNUR NONE DETECTED 06/23/2012 0531   LABBENZ NONE DETECTED 06/23/2012 0531   AMPHETMU NONE DETECTED 06/23/2012 0531   THCU NONE DETECTED 06/23/2012 0531   LABBARB NONE DETECTED 06/23/2012 0531    Alcohol Level: No results found for this basename: ETH,  in the last 72 hours Urinalysis:  Recent Labs  06/23/12 0531  COLORURINE YELLOW  LABSPEC >1.030*  PHURINE 5.5  GLUCOSEU 100*  HGBUR MODERATE*  BILIRUBINUR NEGATIVE  KETONESUR NEGATIVE  PROTEINUR 100*  UROBILINOGEN 0.2  NITRITE NEGATIVE  LEUKOCYTESUR NEGATIVE   Misc. Labs:  ABGS  Recent Labs  06/23/12 0848  PHART 7.355  PO2ART 91.7  TCO2 19.5  HCO3 21.7   CULTURES Recent Results (from the past 240 hour(s))  CULTURE, BLOOD (ROUTINE X 2)     Status: None   Collection Time    06/23/12  5:16 AM      Result Value Range Status   Specimen Description Blood   Final   Special Requests NONE   Final   Culture NO GROWTH <24 HRS   Final   Report Status PENDING   Incomplete  CULTURE, BLOOD (ROUTINE X 2)     Status: None   Collection Time    06/23/12  5:41 AM      Result Value Range Status   Specimen Description Blood   Final   Special Requests NONE   Final   Culture NO GROWTH <24 HRS   Final   Report Status PENDING    Incomplete  MRSA PCR SCREENING     Status: None   Collection Time    06/23/12  6:48 AM      Result Value Range Status   MRSA by PCR NEGATIVE  NEGATIVE Final   Comment:            The GeneXpert MRSA Assay (FDA     approved for NASAL specimens     only), is one component of a     comprehensive MRSA colonization     surveillance program. It is not     intended to diagnose MRSA     infection nor to guide or     monitor treatment for     MRSA infections.   Studies/Results: Dg Chest Portable 1 View  06/23/2012   *RADIOLOGY REPORT*  Clinical Data: Shortness of breath; history of asthma.  PORTABLE CHEST - 1 VIEW  Comparison: Chest radiograph performed 11/12/2008  Findings: The lungs are well-aerated and clear.  There is no evidence of focal opacification, pleural effusion or pneumothorax.  The cardiomediastinal silhouette is within normal limits.  No acute osseous abnormalities are seen.  IMPRESSION: No acute cardiopulmonary process seen.   Original Report Authenticated By: Tonia Ghent, M.D.    Medications:  Prior to Admission:  Prescriptions prior to admission  Medication Sig Dispense Refill  . albuterol (PROVENTIL HFA;VENTOLIN HFA) 108 (90 BASE) MCG/ACT inhaler Inhale 2 puffs into the lungs every 6 (six) hours as needed for wheezing or shortness of breath.      Marland Kitchen albuterol (PROVENTIL) (2.5 MG/3ML) 0.083% nebulizer solution Take 2.5 mg by nebulization every 4 (four) hours as needed for wheezing or shortness of breath.      . benztropine (COGENTIN) 1 MG tablet Take 1 mg by mouth at bedtime.      . ziprasidone (GEODON) 80 MG capsule Take 80 mg by mouth at bedtime.       Scheduled: . albuterol  2.5 mg Nebulization Q4H WA  . aspirin EC  81 mg Oral Daily  . benztropine  1 mg Oral QHS  . Chlorhexidine Gluconate Cloth  6 each Topical Once  . enoxaparin (LOVENOX) injection  40 mg Subcutaneous Q24H  . guaiFENesin  1,200 mg Oral BID  . insulin aspart  0-15 Units Subcutaneous TID WC  .  insulin aspart  0-5 Units Subcutaneous QHS  . ipratropium  0.5 mg Nebulization Q4H WA  . levofloxacin (LEVAQUIN) IV  750 mg Intravenous Q24H  . methylPREDNISolone (SOLU-MEDROL) injection  125 mg Intravenous Q6H  . sodium chloride  1,000 mL Intravenous Once  . sodium chloride  3 mL Intravenous Q12H  . ziprasidone  80 mg Oral  QHS   Continuous: . 0.9 % NaCl with KCl 20 mEq / L 125 mL/hr at 06/24/12 0500   ZOX:[id number], albuterol, bisacodyl, ondansetron (ZOFRAN) IV  Assesment: He came in with COPD with acute exacerbation and respiratory failure. He had fairly marked lactic acidosis which is much improved. He is overall better. Principal Problem:   Lactic acidosis Active Problems:   Dehydration   COPD with acute exacerbation   Schizophrenia    Plan: Continue current treatments with inhaled bronchodilators antibiotics and steroids    LOS: 1 day   Jametta Moorehead L 06/24/2012, 8:22 AM

## 2012-06-24 NOTE — Care Management Note (Signed)
    Page 1 of 1   06/26/2012     9:30:11 AM   CARE MANAGEMENT NOTE 06/26/2012  Patient:  Terry [name], Terry [name]   Account Number:  [id number]  Date Initiated:  06/24/2012  Documentation initiated by:  Rosemary Holms  Subjective/Objective Assessment:   Pt admitted from home. C/O SOB for about [name] month. Pt does not have O2 at home and RN will work with Pt to see if he may qualify for home O2. No other needs identified     Action/Plan:   Anticipated DC Date:  06/25/2012   Anticipated DC Plan:  HOME/SELF CARE         Choice offered to / List presented to:             Status of service:  Completed, signed off Medicare Important Message given?   (If response is "NO", the following Medicare IM given date fields will be blank) Date Medicare IM given:   Date Additional Medicare IM given:    Discharge Disposition:  HOME/SELF CARE  Per UR Regulation:    If discussed at Long Length of Stay Meetings, dates discussed:    Comments:  06/26/12 0930 Arlyss Queen, RN BSN CM Pt discharged home today. No CM needs noted.  06/25/12 1400 Anibal Henderson RN pt now without O2 and sats are good. He will not need O2 at home. probable D/C home in AM 06/24/12 Amy Leanord Hawking RN BSN CM

## 2012-06-24 NOTE — Progress Notes (Signed)
Oxygen was removed at 1630 and patient's 02 saturation before removal was 97% and after 5-10 minutes he dropped his 02 sats to 95%. He then ambulated without 02 and saturation rate before ambulation was 97% and dropped to 95% during ambulation. He tolerated ambulation well and is still without 02 and saturation rate now is 95% at rest.

## 2012-06-25 LAB — GLUCOSE, CAPILLARY
Glucose-Capillary: 162 mg/dL — ABNORMAL HIGH (ref 70–99)
Glucose-Capillary: 136 mg/dL — ABNORMAL HIGH (ref 70–99)
Glucose-Capillary: 134 mg/dL — ABNORMAL HIGH (ref 70–99)
Glucose-Capillary: 123 mg/dL — ABNORMAL HIGH (ref 70–99)

## 2012-06-25 MED ORDER — PREDNISONE 20 MG PO TABS
40.0000 mg | ORAL_TABLET | Freq: Every day | ORAL | Status: DC
  Administered 2012-06-25 – 2012-06-26 (×2): 40 mg via ORAL
  Filled 2012-06-25 (×2): qty 2

## 2012-06-25 NOTE — Progress Notes (Signed)
Patient being transferred to dept 300. Reported called and given to Maralyn Sago, Charity fundraiser. Patient alert, oriented and in stable condition at the time of transport. Patient transferred to room 324 in wheelchair by NT. Patient not on oxygen or telemetry at the time of transport. Patient's belongings transported with him to room 324.

## 2012-06-25 NOTE — Progress Notes (Signed)
Subjective: Patient feels much better. His breathing is improving/ Objective: Vital signs in last 24 hours: Temp:  [97.6 F (36.4 C)-99.1 F (37.3 C)] 97.8 F (36.6 C) (06/04 0730) Pulse Rate:  [77-111] 77 (06/04 0600) Resp:  [15-28] 20 (06/04 0600) BP: (99-152)/(57-106) 122/75 mmHg (06/04 0600) SpO2:  [91 %-97 %] 93 % (06/04 0752) Weight change:  Last BM Date: 06/23/12  Intake/Output from previous day: 06/03 0701 - 06/04 0700 In: 3764.6 [P.O.:475; I.V.:3139.6; IV Piggyback:150] Out: 3575 [Urine:3575]  PHYSICAL EXAM General appearance: cooperative and no distress Resp: diminished breath sounds bilaterally and rhonchi bilaterally Cardio: S1, S2 normal GI: soft, non-tender; bowel sounds normal; no masses,  no organomegaly Extremities: extremities normal, atraumatic, no cyanosis or edema  Lab Results:    @labtest @ ABGS  Recent Labs  06/23/12 0848  PHART 7.355  PO2ART 91.7  TCO2 19.5  HCO3 21.7   CULTURES Recent Results (from the past 240 hour(s))  CULTURE, BLOOD (ROUTINE X 2)     Status: None   Collection Time    06/23/12  5:16 AM      Result Value Range Status   Specimen Description BLOOD RIGHT ARM   Final   Special Requests BOTTLES DRAWN AEROBIC AND ANAEROBIC 6CC   Final   Culture NO GROWTH 1 DAY   Final   Report Status PENDING   Incomplete  CULTURE, BLOOD (ROUTINE X 2)     Status: None   Collection Time    06/23/12  5:41 AM      Result Value Range Status   Specimen Description BLOOD RIGHT ARM   Final   Special Requests BOTTLES DRAWN AEROBIC AND ANAEROBIC 6CC   Final   Culture NO GROWTH 1 DAY   Final   Report Status PENDING   Incomplete  MRSA PCR SCREENING     Status: None   Collection Time    06/23/12  6:48 AM      Result Value Range Status   MRSA by PCR NEGATIVE  NEGATIVE Final   Comment:            The GeneXpert MRSA Assay (FDA     approved for NASAL specimens     only), is one component of a     comprehensive MRSA colonization     surveillance  program. It is not     intended to diagnose MRSA     infection nor to guide or     monitor treatment for     MRSA infections.   Studies/Results: No results found.  Medications: I have reviewed the patient's current medications.  Assesment: Principal Problem:   Lactic acidosis Active Problems:   Dehydration   COPD with acute exacerbation   Schizophrenia    Plan:  Continue Iv antibiotics  Steroid will be changed to po Continue nebulizer treatment Continue regular treatment Pulmonary consult appreciated.    LOS: 2 days   Syniyah Bourne 06/25/2012, 8:13 AM

## 2012-06-25 NOTE — Progress Notes (Signed)
Ambulated pt in hallway to check oxygen saturation and assess for need of home oxygen. Pt HR was tachy and ranged from 110 - 125 during ambulation. Pt's O2 saturation never dropped below 93% on RA while ambulating. At rest he stabilized at 95%. Pt experienced no SOB, no dyspnea, nor any CP. Will continue to monitor.

## 2012-06-25 NOTE — Progress Notes (Signed)
Subjective: He looks better. His oxygenation has been good. His white blood count has come up but clinically he is much improved. He has no complaints of shortness of breath.  Objective: Vital signs in last 24 hours: Temp:  [97.6 F (36.4 C)-99.1 F (37.3 C)] 97.8 F (36.6 C) (06/04 0730) Pulse Rate:  [77-111] 77 (06/04 0600) Resp:  [15-28] 20 (06/04 0600) BP: (99-152)/(57-106) 122/75 mmHg (06/04 0600) SpO2:  [91 %-97 %] 94 % (06/04 0600) Weight change:  Last BM Date: 06/23/12  Intake/Output from previous day: 06/03 0701 - 06/04 0700 In: 3764.6 [P.O.:475; I.V.:3139.6; IV Piggyback:150] Out: 3575 [Urine:3575]  PHYSICAL EXAM General appearance: alert, cooperative and no distress Resp: clear to auscultation bilaterally Cardio: regular rate and rhythm, S1, S2 normal, no murmur, click, rub or gallop GI: soft, non-tender; bowel sounds normal; no masses,  no organomegaly Extremities: extremities normal, atraumatic, no cyanosis or edema  Lab Results:    Basic Metabolic Panel:  Recent Labs  40/98/11 0305 06/24/12 0433  NA 136 134*  K 4.0 4.5  CL 99 103  CO2 20 22  GLUCOSE 223* 176*  BUN 12 13  CREATININE 1.40* 1.04  CALCIUM 8.8 9.1   Liver Function Tests:  Recent Labs  06/23/12 0628  AST 38*  ALT 38  ALKPHOS 128*  BILITOT 0.4  PROT 7.5  ALBUMIN 3.2*   No results found for this basename: LIPASE, AMYLASE,  in the last 72 hours No results found for this basename: AMMONIA,  in the last 72 hours CBC:  Recent Labs  06/23/12 0305 06/24/12 0433  WBC 5.6 12.9*  NEUTROABS 2.5  --   HGB 14.8 14.6  HCT 44.7 42.6  MCV 81.7 79.2  PLT 187 199   Cardiac Enzymes: No results found for this basename: CKTOTAL, CKMB, CKMBINDEX, TROPONINI,  in the last 72 hours BNP: No results found for this basename: PROBNP,  in the last 72 hours D-Dimer: No results found for this basename: DDIMER,  in the last 72 hours CBG:  Recent Labs  06/23/12 1150 06/23/12 1628  06/23/12 2118 06/24/12 0718 06/24/12 1132 06/24/12 1619  GLUCAP 104* 170* 164* 163* 171* 169*   Hemoglobin A1C:  Recent Labs  06/23/12 0750  HGBA1C 6.0*   Fasting Lipid Panel: No results found for this basename: CHOL, HDL, LDLCALC, TRIG, CHOLHDL, LDLDIRECT,  in the last 72 hours Thyroid Function Tests:  Recent Labs  06/23/12 0628  TSH 2.332   Anemia Panel: No results found for this basename: VITAMINB12, FOLATE, FERRITIN, TIBC, IRON, RETICCTPCT,  in the last 72 hours Coagulation: No results found for this basename: LABPROT, INR,  in the last 72 hours Urine Drug Screen: Drugs of Abuse     Component Value Date/Time   LABOPIA NONE DETECTED 06/23/2012 0531   COCAINSCRNUR NONE DETECTED 06/23/2012 0531   LABBENZ NONE DETECTED 06/23/2012 0531   AMPHETMU NONE DETECTED 06/23/2012 0531   THCU NONE DETECTED 06/23/2012 0531   LABBARB NONE DETECTED 06/23/2012 0531    Alcohol Level: No results found for this basename: ETH,  in the last 72 hours Urinalysis:  Recent Labs  06/23/12 0531  COLORURINE YELLOW  LABSPEC >1.030*  PHURINE 5.5  GLUCOSEU 100*  HGBUR MODERATE*  BILIRUBINUR NEGATIVE  KETONESUR NEGATIVE  PROTEINUR 100*  UROBILINOGEN 0.2  NITRITE NEGATIVE  LEUKOCYTESUR NEGATIVE   Misc. Labs:  ABGS  Recent Labs  06/23/12 0848  PHART 7.355  PO2ART 91.7  TCO2 19.5  HCO3 21.7   CULTURES Recent Results (from the  past 240 hour(s))  CULTURE, BLOOD (ROUTINE X 2)     Status: None   Collection Time    06/23/12  5:16 AM      Result Value Range Status   Specimen Description BLOOD RIGHT ARM   Final   Special Requests BOTTLES DRAWN AEROBIC AND ANAEROBIC 6CC   Final   Culture NO GROWTH 1 DAY   Final   Report Status PENDING   Incomplete  CULTURE, BLOOD (ROUTINE X 2)     Status: None   Collection Time    06/23/12  5:41 AM      Result Value Range Status   Specimen Description BLOOD RIGHT ARM   Final   Special Requests BOTTLES DRAWN AEROBIC AND ANAEROBIC 6CC   Final   Culture  NO GROWTH 1 DAY   Final   Report Status PENDING   Incomplete  MRSA PCR SCREENING     Status: None   Collection Time    06/23/12  6:48 AM      Result Value Range Status   MRSA by PCR NEGATIVE  NEGATIVE Final   Comment:            The GeneXpert MRSA Assay (FDA     approved for NASAL specimens     only), is one component of a     comprehensive MRSA colonization     surveillance program. It is not     intended to diagnose MRSA     infection nor to guide or     monitor treatment for     MRSA infections.   Studies/Results: No results found.  Medications:  Prior to Admission:  Prescriptions prior to admission  Medication Sig Dispense Refill  . albuterol (PROVENTIL HFA;VENTOLIN HFA) 108 (90 BASE) MCG/ACT inhaler Inhale 2 puffs into the lungs every 6 (six) hours as needed for wheezing or shortness of breath.      Marland Kitchen albuterol (PROVENTIL) (2.5 MG/3ML) 0.083% nebulizer solution Take 2.5 mg by nebulization every 4 (four) hours as needed for wheezing or shortness of breath.      . benztropine (COGENTIN) 1 MG tablet Take 1 mg by mouth at bedtime.      . ziprasidone (GEODON) 80 MG capsule Take 80 mg by mouth at bedtime.       Scheduled: . albuterol  2.5 mg Nebulization Q4H WA  . aspirin EC  81 mg Oral Daily  . benztropine  1 mg Oral QHS  . Chlorhexidine Gluconate Cloth  6 each Topical Once  . enoxaparin (LOVENOX) injection  40 mg Subcutaneous Q24H  . guaiFENesin  1,200 mg Oral BID  . insulin aspart  0-15 Units Subcutaneous TID WC  . insulin aspart  0-5 Units Subcutaneous QHS  . ipratropium  0.5 mg Nebulization Q4H WA  . levofloxacin (LEVAQUIN) IV  750 mg Intravenous Q24H  . methylPREDNISolone (SOLU-MEDROL) injection  125 mg Intravenous Q6H  . sodium chloride  1,000 mL Intravenous Once  . sodium chloride  3 mL Intravenous Q12H  . ziprasidone  80 mg Oral QHS   Continuous: . 0.9 % NaCl with KCl 20 mEq / L 125 mL/hr at 06/25/12 0600   NFA:OZHYQMVHQIONG, albuterol, bisacodyl, ondansetron  (ZOFRAN) IV  Assesment: He was admitted with COPD with acute exacerbation and acute respiratory failure. He had fairly marked lacticacidemia. His oxygenation is better. He looks better. He feels better. He was dehydrated and that has improved. He has schizophrenia which complicates his care Principal Problem:   Lactic acidosis Active  Problems:   Dehydration   COPD with acute exacerbation   Schizophrenia    Plan: No change in treatments.    LOS: 2 days   Jovee Dettinger L 06/25/2012, 7:45 AM

## 2012-06-26 LAB — GLUCOSE, CAPILLARY

## 2012-06-26 MED ORDER — PREDNISONE 20 MG PO TABS: 10.0000 mg | ORAL_TABLET | Freq: Every day | ORAL | Status: DC

## 2012-06-26 MED ORDER — LEVOFLOXACIN 500 MG PO TABS: 500.0000 mg | ORAL_TABLET | Freq: Every day | ORAL | Status: DC

## 2012-06-26 NOTE — Progress Notes (Signed)
Pt is to be discharged home today. Pt is in NAD, IV is out, all paperwork has been reviewed/discussed with patient, and there are no questions/concerns at this time. Assessment is unchanged from this morning. Pt is to be accompanied downstairs by staff and family via wheelchair.  

## 2012-06-26 NOTE — Discharge Summary (Signed)
Physician Discharge Summary  Patient ID: Terry [name] MRN: [id number] DOB/AGE: 04-27-1955 57 y.o. Primary Care Physician:Gerlean Cid, MD Admit date: 06/23/2012 Discharge date: 06/26/2012    Discharge Diagnoses:   Principal Problem:   Lactic acidosis Active Problems:   Dehydration   COPD with acute exacerbation   Schizophrenia     Medication List    TAKE these medications       albuterol (2.5 MG/3ML) 0.083% nebulizer solution  Commonly known as:  PROVENTIL  Take 2.5 mg by nebulization every 4 (four) hours as needed for wheezing or shortness of breath.     albuterol 108 (90 BASE) MCG/ACT inhaler  Commonly known as:  PROVENTIL HFA;VENTOLIN HFA  Inhale 2 puffs into the lungs every 6 (six) hours as needed for wheezing or shortness of breath.     benztropine 1 MG tablet  Commonly known as:  COGENTIN  Take 1 mg by mouth at bedtime.     levofloxacin 500 MG tablet  Commonly known as:  LEVAQUIN  Take 1 tablet (500 mg total) by mouth daily.     predniSONE 20 MG tablet  Commonly known as:  DELTASONE  Take 0.5 tablets (10 mg total) by mouth daily with breakfast.     ziprasidone 80 MG capsule  Commonly known as:  GEODON  Take 80 mg by mouth at bedtime.        Discharged Condition: improved    Consults: pulmonary  Significant Diagnostic Studies: Dg Chest Portable 1 View  06/23/2012   *RADIOLOGY REPORT*  Clinical Data: Shortness of breath; history of asthma.  PORTABLE CHEST - 1 VIEW  Comparison: Chest radiograph performed 11/12/2008  Findings: The lungs are well-aerated and clear.  There is no evidence of focal opacification, pleural effusion or pneumothorax.  The cardiomediastinal silhouette is within normal limits.  No acute osseous abnormalities are seen.  IMPRESSION: No acute cardiopulmonary process seen.   Original Report Authenticated By: Tonia Ghent, M.D.    Lab Results: Basic Metabolic Panel:  Recent Labs  11/91/47 0433  NA 134*  K 4.5  CL 103  CO2 22   GLUCOSE 176*  BUN 13  CREATININE 1.04  CALCIUM 9.1   Liver Function Tests: No results found for this basename: AST, ALT, ALKPHOS, BILITOT, PROT, ALBUMIN,  in the last 72 hours   CBC:  Recent Labs  06/24/12 0433  WBC 12.9*  HGB 14.6  HCT 42.6  MCV 79.2  PLT 199    Recent Results (from the past 240 hour(s))  CULTURE, BLOOD (ROUTINE X 2)     Status: None   Collection Time    06/23/12  5:16 AM      Result Value Range Status   Specimen Description BLOOD RIGHT ARM   Final   Special Requests BOTTLES DRAWN AEROBIC AND ANAEROBIC 6CC   Final   Culture NO GROWTH 2 DAYS   Final   Report Status PENDING   Incomplete  CULTURE, BLOOD (ROUTINE X 2)     Status: None   Collection Time    06/23/12  5:41 AM      Result Value Range Status   Specimen Description BLOOD RIGHT ARM   Final   Special Requests BOTTLES DRAWN AEROBIC AND ANAEROBIC 6CC   Final   Culture NO GROWTH 2 DAYS   Final   Report Status PENDING   Incomplete  MRSA PCR SCREENING     Status: None   Collection Time    06/23/12  6:48 AM  Result Value Range Status   MRSA by PCR NEGATIVE  NEGATIVE Final   Comment:            The GeneXpert MRSA Assay (FDA     approved for NASAL specimens     only), is one component of a     comprehensive MRSA colonization     surveillance program. It is not     intended to diagnose MRSA     infection nor to guide or     monitor treatment for     MRSA infections.     Hospital Course:  This is a 57 years old male patient with history of multiple medical problems was admitted due lactic acidosis and COPD exacerbations. He was treated with IV antibiotics and IV steroid. Pulmonary consult was done and patient improved. He is discharged home in stable condition.  Discharge Exam: Blood pressure 151/79, pulse 83, temperature 98 F (36.7 C), temperature source Oral, resp. rate 20, height 5\' 8"  (1.727 m), weight 121.7 kg (268 lb 4.8 oz), SpO2 95.00%.    Disposition:   Home      Signed: Ancelmo Hunt  06/26/2012, 7:58 AM

## 2012-06-30 LAB — CULTURE, BLOOD (ROUTINE X 2)

## 2012-07-13 ENCOUNTER — Encounter (HOSPITAL_COMMUNITY): Payer: Self-pay | Admitting: *Deleted

## 2012-07-13 ENCOUNTER — Emergency Department (HOSPITAL_COMMUNITY)
Admission: EM | Admit: 2012-07-13 | Discharge: 2012-07-13 | Disposition: A | Discharge status: Home / Self Care | Payer: PRIVATE HEALTH INSURANCE | Attending: Emergency Medicine | Admitting: Emergency Medicine

## 2012-07-13 DIAGNOSIS — Z87891 Personal history of nicotine dependence: Secondary | ICD-10-CM | POA: Insufficient documentation

## 2012-07-13 DIAGNOSIS — Z8709 Personal history of other diseases of the respiratory system: Secondary | ICD-10-CM | POA: Insufficient documentation

## 2012-07-13 DIAGNOSIS — F410 Panic disorder [episodic paroxysmal anxiety] without agoraphobia: Secondary | ICD-10-CM | POA: Insufficient documentation

## 2012-07-13 DIAGNOSIS — Z79899 Other long term (current) drug therapy: Secondary | ICD-10-CM | POA: Insufficient documentation

## 2012-07-13 DIAGNOSIS — IMO0002 Reserved for concepts with insufficient information to code with codable children: Secondary | ICD-10-CM | POA: Insufficient documentation

## 2012-07-13 DIAGNOSIS — F209 Schizophrenia, unspecified: Secondary | ICD-10-CM | POA: Insufficient documentation

## 2012-07-13 NOTE — ED Provider Notes (Signed)
History  This chart was scribed for Terry Hutching, MD by Manuela Schwartz, ED scribe. This patient was seen in room APA07/[facility]07 and the patient's care was started at 2037.   CSN: [id number]  Arrival date & time 07/13/12  2037   First MD Initiated Contact with Patient 07/13/12 2057      Chief Complaint  Patient presents with  . Shortness of Breath   Patient is a 57 y.o. male presenting with shortness of breath. The history is provided by the patient. No language interpreter was used.  Shortness of Breath Severity:  Mild Onset quality:  Sudden Duration:  20 minutes Progression:  Resolved Chronicity:  New Context comment:  Panic attack Relieved by:  Nothing Worsened by:  Nothing tried Ineffective treatments:  Inhaler Associated symptoms: no fever and no vomiting    HPI Comments: Terry [name] is a 57 y.o. male who presents to the Emergency Department complaining of sudden onset SOB PTA after he states felt a mild panic attack because he ate dinner later than usual. His family with him states he looks back to normal and that he became anxious/panicked around dinner time causing him to feel SOB. He states that he feels better now and is okay to go home. He denies any current SOB and is speaking in full sentences. He denies any CP.      Past Medical History  Diagnosis Date  . Bronchitis   . Schizophrenia     Past Surgical History  Procedure Laterality Date  . Neck surgery      Family History  Problem Relation Age of Onset  . Diabetes Father     History  Substance Use Topics  . Smoking status: Former Games developer  . Smokeless tobacco: Not on file  . Alcohol Use: No      Review of Systems  Constitutional: Negative for fever and chills.  Respiratory: Positive for shortness of breath (SOB which has since improved on arrival).   Gastrointestinal: Negative for nausea and vomiting.  Neurological: Negative for weakness.  All other systems reviewed and are negative.   A complete  10 system review of systems was obtained and all systems are negative except as noted in the HPI and PMH.   Allergies  Review of patient's allergies indicates no known allergies.  Home Medications   Current Outpatient Rx  Name  Route  Sig  Dispense  Refill  . albuterol (PROVENTIL HFA;VENTOLIN HFA) 108 (90 BASE) MCG/ACT inhaler   Inhalation   Inhale 2 puffs into the lungs every 6 (six) hours as needed for wheezing or shortness of breath.         Marland Kitchen albuterol (PROVENTIL) (2.5 MG/3ML) 0.083% nebulizer solution   Nebulization   Take 2.5 mg by nebulization every 4 (four) hours as needed for wheezing or shortness of breath.         . benztropine (COGENTIN) 1 MG tablet   Oral   Take 1 mg by mouth at bedtime.         Marland Kitchen levofloxacin (LEVAQUIN) 500 MG tablet   Oral   Take 1 tablet (500 mg total) by mouth daily.   5 tablet   0   . predniSONE (DELTASONE) 20 MG tablet   Oral   Take 0.5 tablets (10 mg total) by mouth daily with breakfast.   30 tablet   0     40 mg po daily for 3 days, 30 mg po daily for 3 da ...   . ziprasidone (GEODON)  80 MG capsule   Oral   Take 80 mg by mouth at bedtime.           Triage Vitals: BP 137/89  Pulse 118  Temp(Src) 97.6 F (36.4 C) (Oral)  Resp 20  Ht 5\' 8"  (1.727 m)  Wt 265 lb (120.203 kg)  BMI 40.3 kg/m2  SpO2 100%  Physical Exam  Nursing note and vitals reviewed. Constitutional: He is oriented to person, place, and time. He appears well-developed and well-nourished.  HENT:  Head: Normocephalic and atraumatic.  Eyes: Conjunctivae and EOM are normal. Pupils are equal, round, and reactive to light.  Neck: Normal range of motion. Neck supple.  Cardiovascular: Normal rate, regular rhythm and normal heart sounds.   Pulmonary/Chest: Effort normal and breath sounds normal. No respiratory distress. He has no wheezes.  Abdominal: Soft. Bowel sounds are normal.  Musculoskeletal: Normal range of motion.  Neurological: He is alert and  oriented to person, place, and time.  Skin: Skin is warm and dry.  Psychiatric: He has a normal mood and affect.    ED Course  Procedures (including critical care time) DIAGNOSTIC STUDIES: Oxygen Saturation is 100% on room air, normal by my interpretation.    COORDINATION OF CARE: At 44 PM Discussed plan to discharge patient home after my initial evaluation with patient who speaks in full sentences and feels improved compared to some initial SOB at home Patient agrees.   Labs Reviewed - No data to display No results found.   No diagnosis found.    MDM  No chest pain or shortness of breath.  History and physical consistent with anxiety attack. Pulse has normalized.  Pulse ox 100%     I personally performed the services described in this documentation, which was scribed in my presence. The recorded information has been reviewed and is accurate.          Terry Hutching, MD 07/13/12 2126

## 2012-07-13 NOTE — ED Notes (Signed)
Pt c/o sob x 20 mins ago. Minimal relief with inhaler

## 2012-08-10 ENCOUNTER — Emergency Department (HOSPITAL_COMMUNITY)
Admission: EM | Admit: 2012-08-10 | Discharge: 2012-08-10 | Disposition: A | Discharge status: Home / Self Care | Payer: PRIVATE HEALTH INSURANCE | Attending: Emergency Medicine | Admitting: Emergency Medicine

## 2012-08-10 ENCOUNTER — Encounter (HOSPITAL_COMMUNITY): Payer: Self-pay | Admitting: Emergency Medicine

## 2012-08-10 DIAGNOSIS — IMO0002 Reserved for concepts with insufficient information to code with codable children: Secondary | ICD-10-CM | POA: Insufficient documentation

## 2012-08-10 DIAGNOSIS — Z87891 Personal history of nicotine dependence: Secondary | ICD-10-CM | POA: Insufficient documentation

## 2012-08-10 DIAGNOSIS — F209 Schizophrenia, unspecified: Secondary | ICD-10-CM | POA: Insufficient documentation

## 2012-08-10 DIAGNOSIS — Z79899 Other long term (current) drug therapy: Secondary | ICD-10-CM | POA: Insufficient documentation

## 2012-08-10 DIAGNOSIS — J441 Chronic obstructive pulmonary disease with (acute) exacerbation: Secondary | ICD-10-CM | POA: Insufficient documentation

## 2012-08-10 MED ORDER — ALBUTEROL SULFATE (5 MG/ML) 0.5% IN NEBU
2.5000 mg | INHALATION_SOLUTION | Freq: Once | RESPIRATORY_TRACT | Status: AC
  Administered 2012-08-10: 2.5 mg via RESPIRATORY_TRACT
  Filled 2012-08-10: qty 0.5

## 2012-08-10 MED ORDER — ALBUTEROL SULFATE (2.5 MG/3ML) 0.083% IN NEBU: 2.5000 mg | INHALATION_SOLUTION | Freq: Four times a day (QID) | RESPIRATORY_TRACT | Status: DC | PRN

## 2012-08-10 MED ORDER — IPRATROPIUM BROMIDE 0.02 % IN SOLN
0.5000 mg | Freq: Once | RESPIRATORY_TRACT | Status: AC
  Administered 2012-08-10: 0.5 mg via RESPIRATORY_TRACT
  Filled 2012-08-10: qty 2.5

## 2012-08-10 MED ORDER — PREDNISONE 50 MG PO TABS
60.0000 mg | ORAL_TABLET | Freq: Once | ORAL | Status: AC
  Administered 2012-08-10: 60 mg via ORAL
  Filled 2012-08-10: qty 1

## 2012-08-10 MED ORDER — PREDNISONE 50 MG PO TABS: 50.0000 mg | ORAL_TABLET | Freq: Every day | ORAL | Status: DC

## 2012-08-10 NOTE — ED Notes (Signed)
Patient c/o shortness of breath "all night"; states "I couldn't sleep".

## 2012-08-10 NOTE — ED Notes (Signed)
Pt resting quietly with eyes closed. Respirations regular, even, and unlabored.

## 2012-08-10 NOTE — ED Provider Notes (Signed)
History    CSN: [id number] Arrival date & time 08/10/12  0238  First MD Initiated Contact with Patient 08/10/12 [phone]     Chief Complaint  Patient presents with  . Shortness of Breath   (Consider location/radiation/quality/duration/timing/severity/associated sxs/prior Treatment) Patient is a 57 y.o. male presenting with shortness of breath. The history is provided by the patient.  Shortness of Breath He had onset at about 9 PM of difficulty breathing and was unable to sleep. He denies chest pain, heaviness, tightness, pressure. There has been a mild, nonproductive cough. Denies fever, chills, sweats. He used his albuterol nebulizer but it did not give him any relief. He states the medication is over a year old. He has received an albuterol and ipratropium nebulizer treatment in the ED and states he is feeling much better. Past Medical History  Diagnosis Date  . Bronchitis   . Schizophrenia    Past Surgical History  Procedure Laterality Date  . Neck surgery     Family History  Problem Relation Age of Onset  . Diabetes Father    History  Substance Use Topics  . Smoking status: Former Games developer  . Smokeless tobacco: Not on file  . Alcohol Use: No    Review of Systems  Respiratory: Positive for shortness of breath.   All other systems reviewed and are negative.    Allergies  Review of patient's allergies indicates no known allergies.  Home Medications   Current Outpatient Rx  Name  Route  Sig  Dispense  Refill  . albuterol (PROVENTIL) (2.5 MG/3ML) 0.083% nebulizer solution   Nebulization   Take 2.5 mg by nebulization every 4 (four) hours as needed for wheezing or shortness of breath.         Marland Kitchen amLODipine (NORVASC) 5 MG tablet   Oral   Take 5 mg by mouth daily.         . benztropine (COGENTIN) 1 MG tablet   Oral   Take 1 mg by mouth at bedtime.         . predniSONE (DELTASONE) 20 MG tablet   Oral   Take 20 mg by mouth daily. Tapered dose(started on  06/26/2012)         . ziprasidone (GEODON) 80 MG capsule   Oral   Take 80 mg by mouth at bedtime.          BP 168/98  Pulse 117  Temp(Src) 98.8 F (37.1 C) (Oral)  Resp 28  Ht 5\' 8"  (1.727 m)  Wt 265 lb (120.203 kg)  BMI 40.3 kg/m2  SpO2 99% Physical Exam  Nursing note and vitals reviewed.  57 year old male, resting comfortably and in no acute distress. Vital signs are significant for tachycardia with heart rate 117, and hypertension with blood pressure 160/98, and tachypnea with respiratory rate of 28. Oxygen saturation is 99%, which is normal. Head is normocephalic and atraumatic. PERRLA, gaze is disconjugate with a right eye deviating laterally. Oropharynx is clear. Neck is nontender and supple without adenopathy or JVD. Back is nontender and there is no CVA tenderness. Lungs have mild, diffuse wheezing without rales or rhonchi. Chest is nontender. Heart has regular rate and rhythm without murmur. Abdomen is soft, flat, nontender without masses or hepatosplenomegaly and peristalsis is normoactive. Extremities have no cyanosis or edema, full range of motion is present. Skin is warm and dry without rash. Neurologic: Mental status is normal, cranial nerves are intact, there are no motor or sensory deficits.  ED Course  Procedures (including critical care time)  1. COPD with acute exacerbation     MDM  COPD exacerbation. You begin this of prednisone and will have his nebulizer treatment repeated. I anticipate sending her home prescriptions for prednisone burst and albuterol for nebulizer. Records are reviewed and he did have a recent hospitalization for COPD exacerbation.  Following the above noted treatment, lungs are clear he felt like he was back to baseline. He is given a prescription for prednisone and albuterol and solution for nebulizer and is to followup with his PCP in 5 days for recheck.  Dione Booze, MD 08/10/12 [phone]

## 2012-08-20 ENCOUNTER — Encounter (HOSPITAL_COMMUNITY): Payer: Self-pay | Admitting: *Deleted

## 2012-08-20 ENCOUNTER — Emergency Department (HOSPITAL_COMMUNITY)
Admission: EM | Admit: 2012-08-20 | Discharge: 2012-08-20 | Disposition: A | Discharge status: Home / Self Care | Payer: PRIVATE HEALTH INSURANCE | Attending: Emergency Medicine | Admitting: Emergency Medicine

## 2012-08-20 DIAGNOSIS — Z87891 Personal history of nicotine dependence: Secondary | ICD-10-CM | POA: Insufficient documentation

## 2012-08-20 DIAGNOSIS — R209 Unspecified disturbances of skin sensation: Secondary | ICD-10-CM | POA: Insufficient documentation

## 2012-08-20 DIAGNOSIS — R5381 Other malaise: Secondary | ICD-10-CM | POA: Insufficient documentation

## 2012-08-20 DIAGNOSIS — R200 Anesthesia of skin: Secondary | ICD-10-CM

## 2012-08-20 DIAGNOSIS — IMO0002 Reserved for concepts with insufficient information to code with codable children: Secondary | ICD-10-CM | POA: Insufficient documentation

## 2012-08-20 DIAGNOSIS — F209 Schizophrenia, unspecified: Secondary | ICD-10-CM | POA: Insufficient documentation

## 2012-08-20 DIAGNOSIS — R42 Dizziness and giddiness: Secondary | ICD-10-CM | POA: Insufficient documentation

## 2012-08-20 DIAGNOSIS — Z8709 Personal history of other diseases of the respiratory system: Secondary | ICD-10-CM | POA: Insufficient documentation

## 2012-08-20 DIAGNOSIS — Z79899 Other long term (current) drug therapy: Secondary | ICD-10-CM | POA: Insufficient documentation

## 2012-08-20 LAB — GLUCOSE, CAPILLARY: Glucose-Capillary: 109 mg/dL — ABNORMAL HIGH (ref 70–99)

## 2012-08-20 NOTE — ED Notes (Addendum)
Pt reporting weakness, dizziness and numbness on both sides of face.  Pt states that he woke up about an hour ago, and "just didn't feel right".  No difficulty with speech noted. Pt reporting that he ran out of Geodon a couple days ago, cannot get refilled till tomorrow.

## 2012-08-20 NOTE — ED Notes (Signed)
FS=[id number]

## 2012-08-20 NOTE — ED Notes (Signed)
Pt resting quietly.  Respirations, regular, even and unlabored.

## 2012-08-20 NOTE — ED Notes (Signed)
When standing and sitting while doing orthostatic vitals patient complained of being dizzy. Said when he laid down he felt better.

## 2012-08-20 NOTE — ED Provider Notes (Signed)
CSN: [id number]     Arrival date & time 08/20/12  0138 History     First MD Initiated Contact with Patient 08/20/12 0149     Chief Complaint  Patient presents with  . Dizziness  . Weakness  . Numbness   (Consider location/radiation/quality/duration/timing/severity/associated sxs/prior Treatment) HPI HPI Comments: Terry [name] is a 57 y.o. male who presents to the Emergency Department complaining of dizziness when he stands, numbness to his face and 'just not feeling right" when he woke up. He states he ate his evening meal around 530 PM. When he stands up he feels dizzy, with surroundings spinning. He denies trouble talking or swallowing, weakness in his arms and legs.  PCP Dr. Felecia Shelling  Past Medical History  Diagnosis Date  . Bronchitis   . Schizophrenia    Past Surgical History  Procedure Laterality Date  . Neck surgery     Family History  Problem Relation Age of Onset  . Diabetes Father    History  Substance Use Topics  . Smoking status: Former Games developer  . Smokeless tobacco: Not on file  . Alcohol Use: No    Review of Systems  Constitutional: Negative for fever.       10 Systems reviewed and are negative for acute change except as noted in the HPI.  HENT: Negative for congestion.   Eyes: Negative for discharge and redness.  Respiratory: Negative for cough and shortness of breath.   Cardiovascular: Negative for chest pain.  Gastrointestinal: Negative for vomiting and abdominal pain.  Musculoskeletal: Negative for back pain.  Skin: Negative for rash.  Neurological: Positive for dizziness and numbness. Negative for syncope and headaches.  Psychiatric/Behavioral:       No behavior change.    Allergies  Review of patient's allergies indicates no known allergies.  Home Medications   Current Outpatient Rx  Name  Route  Sig  Dispense  Refill  . albuterol (PROVENTIL) (2.5 MG/3ML) 0.083% nebulizer solution   Nebulization   Take 2.5 mg by nebulization every 4 (four)  hours as needed for wheezing or shortness of breath.         Marland Kitchen albuterol (PROVENTIL) (2.5 MG/3ML) 0.083% nebulizer solution   Nebulization   Take 3 mLs (2.5 mg total) by nebulization every 6 (six) hours as needed for wheezing.   75 mL   12   . amLODipine (NORVASC) 5 MG tablet   Oral   Take 5 mg by mouth daily.         . benztropine (COGENTIN) 1 MG tablet   Oral   Take 1 mg by mouth at bedtime.         . predniSONE (DELTASONE) 20 MG tablet   Oral   Take 20 mg by mouth daily. Tapered dose(started on 06/26/2012)         . predniSONE (DELTASONE) 50 MG tablet   Oral   Take 1 tablet (50 mg total) by mouth daily.   5 tablet   0   . ziprasidone (GEODON) 80 MG capsule   Oral   Take 80 mg by mouth at bedtime.          BP 112/76  Pulse 110  Temp(Src) 98.1 F (36.7 C) (Oral)  Resp 22  Ht 5\' 8"  (1.727 m)  Wt 265 lb (120.203 kg)  BMI 40.3 kg/m2  SpO2 96% Physical Exam  Nursing note and vitals reviewed. Constitutional: He appears well-developed and well-nourished.  Awake, alert, nontoxic appearance.  HENT:  Head: Normocephalic and  atraumatic.  Sensation to light touch is normal in his left and right side of his face.  Eyes: EOM are normal. Pupils are equal, round, and reactive to light.  Neck: Neck supple.  Cardiovascular: Normal rate and intact distal pulses.   Pulmonary/Chest: Effort normal and breath sounds normal. He exhibits no tenderness.  Abdominal: Soft. There is no tenderness. There is no rebound.  Musculoskeletal: He exhibits no tenderness.  Baseline ROM, no obvious new focal weakness.  Neurological:  Mental status and motor strength appears baseline for patient and situation.  Skin: No rash noted.  Psychiatric: He has a normal mood and affect.    ED Course   Procedures (including critical care time) Results for orders placed during the hospital encounter of 08/20/12  GLUCOSE, CAPILLARY      Result Value Range   Glucose-Capillary 109 (*) 70 - 99  mg/dL    4696 Orthostatic VS were normal. Ambulated without dizziness. Numbness has resolved. Glucose is 109. Patient feels better. Ready for discharge. MDM  Patient awakened with dizziness when he stood, numbness to his face, and not feeling right. Since being in the ER, without intervention he feels better. Dizziness has resolved, numbness has resolved. He feels well. Pt stable in ED with no significant deterioration in condition.The patient appears reasonably screened and/or stabilized for discharge and I doubt any other medical condition or other [facility] requiring further screening, evaluation, or treatment in the ED at this time prior to discharge.  MDM Reviewed: nursing note and vitals Interpretation: labs     Nicoletta Dress. Colon Branch, MD 08/20/12 2952

## 2012-11-02 DIAGNOSIS — I959 Hypotension, unspecified: Secondary | ICD-10-CM | POA: Diagnosis present

## 2012-11-02 DIAGNOSIS — Z23 Encounter for immunization: Secondary | ICD-10-CM

## 2012-11-02 DIAGNOSIS — J441 Chronic obstructive pulmonary disease with (acute) exacerbation: Secondary | ICD-10-CM | POA: Diagnosis present

## 2012-11-02 DIAGNOSIS — Z87891 Personal history of nicotine dependence: Secondary | ICD-10-CM

## 2012-11-02 DIAGNOSIS — I214 Non-ST elevation (NSTEMI) myocardial infarction: Secondary | ICD-10-CM | POA: Diagnosis present

## 2012-11-02 DIAGNOSIS — J9600 Acute respiratory failure, unspecified whether with hypoxia or hypercapnia: Principal | ICD-10-CM | POA: Diagnosis present

## 2012-11-02 DIAGNOSIS — Z79899 Other long term (current) drug therapy: Secondary | ICD-10-CM

## 2012-11-02 DIAGNOSIS — Z833 Family history of diabetes mellitus: Secondary | ICD-10-CM

## 2012-11-02 DIAGNOSIS — F209 Schizophrenia, unspecified: Secondary | ICD-10-CM | POA: Diagnosis present

## 2012-11-03 ENCOUNTER — Emergency Department (HOSPITAL_COMMUNITY): Payer: PRIVATE HEALTH INSURANCE

## 2012-11-03 ENCOUNTER — Inpatient Hospital Stay (HOSPITAL_COMMUNITY): Payer: PRIVATE HEALTH INSURANCE

## 2012-11-03 ENCOUNTER — Encounter (HOSPITAL_COMMUNITY): Payer: Self-pay | Admitting: Emergency Medicine

## 2012-11-03 ENCOUNTER — Inpatient Hospital Stay (HOSPITAL_COMMUNITY)
Admission: EM | Admit: 2012-11-03 | Discharge: 2012-11-07 | DRG: 208 | Disposition: A | Discharge status: Home Health | Payer: PRIVATE HEALTH INSURANCE | Attending: Internal Medicine | Admitting: Internal Medicine

## 2012-11-03 DIAGNOSIS — I517 Cardiomegaly: Secondary | ICD-10-CM

## 2012-11-03 DIAGNOSIS — F209 Schizophrenia, unspecified: Secondary | ICD-10-CM

## 2012-11-03 DIAGNOSIS — I214 Non-ST elevation (NSTEMI) myocardial infarction: Secondary | ICD-10-CM

## 2012-11-03 DIAGNOSIS — I959 Hypotension, unspecified: Secondary | ICD-10-CM | POA: Diagnosis present

## 2012-11-03 DIAGNOSIS — J9600 Acute respiratory failure, unspecified whether with hypoxia or hypercapnia: Principal | ICD-10-CM | POA: Diagnosis present

## 2012-11-03 DIAGNOSIS — J441 Chronic obstructive pulmonary disease with (acute) exacerbation: Secondary | ICD-10-CM

## 2012-11-03 HISTORY — DX: Chronic obstructive pulmonary disease, unspecified: J44.9

## 2012-11-03 LAB — LACTIC ACID, PLASMA: Lactic Acid, Venous: 3.2 mmol/L — ABNORMAL HIGH (ref 0.5–2.2)

## 2012-11-03 LAB — BLOOD GAS, ARTERIAL
Acid-base deficit: 3.8 mmol/L — ABNORMAL HIGH (ref 0.0–2.0)
Acid-base deficit: 2.7 mmol/L — ABNORMAL HIGH (ref 0.0–2.0)
Bicarbonate: 22.4 mEq/L (ref 20.0–24.0)
Bicarbonate: 21.6 mEq/L (ref 20.0–24.0)
Drawn by: 22223
FIO2: 100 %
FIO2: 50 %
MECHVT: 600 mL
MECHVT: 600 mL
O2 Saturation: 99.6 %
O2 Saturation: 99.3 %
PEEP: 5 cmH2O
Patient temperature: 37
RATE: 12 resp/min
RATE: 16 resp/min
TCO2: 20.7 mmol/L (ref 0–100)
TCO2: 19.1 mmol/L (ref 0–100)
pCO2 arterial: 53.4 mmHg — ABNORMAL HIGH (ref 35.0–45.0)
pH, Arterial: 7.246 — ABNORMAL LOW (ref 7.350–7.450)
pO2, Arterial: 499 mmHg — ABNORMAL HIGH (ref 80.0–100.0)
pO2, Arterial: 203 mmHg — ABNORMAL HIGH (ref 80.0–100.0)

## 2012-11-03 LAB — TROPONIN I
Troponin I: 0.53 ng/mL (ref <0.30)
Troponin I: <0.3 ng/mL (ref <0.30)

## 2012-11-03 LAB — POCT I-STAT, CHEM 8
BUN: 23 mg/dL (ref 6–23)
Calcium, Ion: 1.22 mmol/L (ref 1.12–1.23)
Chloride: 106 mEq/L (ref 96–112)
Creatinine, Ser: 1.4 mg/dL — ABNORMAL HIGH (ref 0.50–1.35)
Glucose, Bld: 217 mg/dL — ABNORMAL HIGH (ref 70–99)
HCT: 50 % (ref 39.0–52.0)
Hemoglobin: 17 g/dL (ref 13.0–17.0)
Potassium: 3.9 mEq/L (ref 3.5–5.1)
Sodium: 141 mEq/L (ref 135–145)
TCO2: 26 mmol/L (ref 0–100)

## 2012-11-03 LAB — URINALYSIS, ROUTINE W REFLEX MICROSCOPIC
Bilirubin Urine: NEGATIVE
Glucose, UA: 250 mg/dL — AB
Ketones, ur: NEGATIVE mg/dL
Leukocytes, UA: NEGATIVE
Nitrite: NEGATIVE
Protein, ur: 30 mg/dL — AB
Specific Gravity, Urine: >1.03 — ABNORMAL HIGH (ref 1.005–1.030)
Urobilinogen, UA: 0.2 mg/dL (ref 0.0–1.0)
pH: 5.5 (ref 5.0–8.0)

## 2012-11-03 LAB — COMPREHENSIVE METABOLIC PANEL
ALT: 25 U/L (ref 0–53)
ALT: 38 U/L (ref 0–53)
Alkaline Phosphatase: 128 U/L — ABNORMAL HIGH (ref 39–117)
Alkaline Phosphatase: 118 U/L — ABNORMAL HIGH (ref 39–117)
BUN: 17 mg/dL (ref 6–23)
CO2: 23 mEq/L (ref 19–32)
CO2: 23 mEq/L (ref 19–32)
Calcium: 8.8 mg/dL (ref 8.4–10.5)
GFR calc Af Amer: 56 mL/min — ABNORMAL LOW (ref >90)
GFR calc Af Amer: 67 mL/min — ABNORMAL LOW (ref >90)
GFR calc non Af Amer: 48 mL/min — ABNORMAL LOW (ref >90)
Glucose, Bld: 224 mg/dL — ABNORMAL HIGH (ref 70–99)
Glucose, Bld: 152 mg/dL — ABNORMAL HIGH (ref 70–99)
Potassium: 3.7 mEq/L (ref 3.5–5.1)
Potassium: 4 mEq/L (ref 3.5–5.1)
Sodium: 136 mEq/L (ref 135–145)
Total Protein: 7.3 g/dL (ref 6.0–8.3)

## 2012-11-03 LAB — CBC
HCT: 44.7 % (ref 39.0–52.0)
HCT: 46.8 % (ref 39.0–52.0)
Hemoglobin: 14.5 g/dL (ref 13.0–17.0)
Hemoglobin: 15 g/dL (ref 13.0–17.0)
MCH: 27.8 pg (ref 26.0–34.0)
RBC: 5.39 MIL/uL (ref 4.22–5.81)
RDW: 15.3 % (ref 11.5–15.5)
WBC: 11.9 10*3/uL — ABNORMAL HIGH (ref 4.0–10.5)
WBC: 15.3 10*3/uL — ABNORMAL HIGH (ref 4.0–10.5)

## 2012-11-03 LAB — GLUCOSE, CAPILLARY
Glucose-Capillary: 166 mg/dL — ABNORMAL HIGH (ref 70–99)
Glucose-Capillary: 134 mg/dL — ABNORMAL HIGH (ref 70–99)
Glucose-Capillary: 121 mg/dL — ABNORMAL HIGH (ref 70–99)

## 2012-11-03 LAB — POCT I-STAT TROPONIN I: Troponin i, poc: 0.03 ng/mL (ref 0.00–0.08)

## 2012-11-03 LAB — URINE MICROSCOPIC-ADD ON

## 2012-11-03 MED ORDER — ONDANSETRON HCL 4 MG PO TABS: 4.0000 mg | ORAL_TABLET | Freq: Four times a day (QID) | ORAL | Status: DC | PRN

## 2012-11-03 MED ORDER — PNEUMOCOCCAL VAC POLYVALENT 25 MCG/0.5ML IJ INJ
0.5000 mL | INJECTION | INTRAMUSCULAR | Status: AC
  Administered 2012-11-04: 0.5 mL via INTRAMUSCULAR
  Filled 2012-11-03: qty 0.5

## 2012-11-03 MED ORDER — FENTANYL CITRATE 0.05 MG/ML IJ SOLN
100.0000 ug | Freq: Once | INTRAMUSCULAR | Status: AC
  Administered 2012-11-03: 100 ug via INTRAVENOUS

## 2012-11-03 MED ORDER — PROPOFOL 10 MG/ML IV EMUL
5.0000 ug/kg/min | Freq: Once | INTRAVENOUS | Status: DC
  Administered 2012-11-03: 2 mL via INTRAVENOUS
  Administered 2012-11-03: 5 ug/kg/min via INTRAVENOUS

## 2012-11-03 MED ORDER — SODIUM CHLORIDE 0.9 % IV SOLN: Freq: Once | INTRAVENOUS | Status: AC

## 2012-11-03 MED ORDER — CHLORHEXIDINE GLUCONATE 0.12 % MT SOLN
15.0000 mL | Freq: Two times a day (BID) | OROMUCOSAL | Status: DC
  Administered 2012-11-03 – 2012-11-04 (×4): 15 mL via OROMUCOSAL
  Filled 2012-11-03 (×4): qty 15

## 2012-11-03 MED ORDER — ALBUTEROL SULFATE (5 MG/ML) 0.5% IN NEBU
2.5000 mg | INHALATION_SOLUTION | RESPIRATORY_TRACT | Status: DC
Start: 2012-11-03 — End: 2012-11-07
  Administered 2012-11-03 – 2012-11-07 (×26): 2.5 mg via RESPIRATORY_TRACT
  Filled 2012-11-03 (×26): qty 0.5

## 2012-11-03 MED ORDER — BIOTENE DRY MOUTH MT LIQD
15.0000 mL | Freq: Four times a day (QID) | OROMUCOSAL | Status: DC
  Administered 2012-11-03 – 2012-11-04 (×6): 15 mL via OROMUCOSAL

## 2012-11-03 MED ORDER — SODIUM CHLORIDE 0.9 % IV SOLN
25.0000 ug/h | INTRAVENOUS | Status: DC
  Administered 2012-11-03: 25 ug/h via INTRAVENOUS
  Administered 2012-11-04: 200 ug/h via INTRAVENOUS
  Filled 2012-11-03: qty 50

## 2012-11-03 MED ORDER — FAMOTIDINE IN NACL 20-0.9 MG/50ML-% IV SOLN
20.0000 mg | Freq: Two times a day (BID) | INTRAVENOUS | Status: DC
  Administered 2012-11-03 – 2012-11-04 (×5): 20 mg via INTRAVENOUS
  Filled 2012-11-03 (×6): qty 50

## 2012-11-03 MED ORDER — ACETAMINOPHEN 325 MG PO TABS: 650.0000 mg | ORAL_TABLET | Freq: Four times a day (QID) | ORAL | Status: DC | PRN

## 2012-11-03 MED ORDER — ALBUTEROL SULFATE (5 MG/ML) 0.5% IN NEBU: 2.5000 mg | INHALATION_SOLUTION | RESPIRATORY_TRACT | Status: DC | PRN

## 2012-11-03 MED ORDER — ASPIRIN 81 MG PO CHEW
81.0000 mg | CHEWABLE_TABLET | Freq: Every day | ORAL | Status: DC
  Administered 2012-11-03 – 2012-11-07 (×5): 81 mg via NASOGASTRIC
  Filled 2012-11-03 (×5): qty 1

## 2012-11-03 MED ORDER — FENTANYL CITRATE 0.05 MG/ML IJ SOLN
INTRAMUSCULAR | Status: AC
  Filled 2012-11-03: qty 2

## 2012-11-03 MED ORDER — SODIUM CHLORIDE 0.9 % IV SOLN
Freq: Once | INTRAVENOUS | Status: AC
  Administered 2012-11-03: 20 mL/h via INTRAVENOUS

## 2012-11-03 MED ORDER — FENTANYL BOLUS VIA INFUSION
25.0000 ug | Freq: Four times a day (QID) | INTRAVENOUS | Status: DC | PRN
  Filled 2012-11-03: qty 100

## 2012-11-03 MED ORDER — ALBUTEROL SULFATE (5 MG/ML) 0.5% IN NEBU: 2.5000 mg | INHALATION_SOLUTION | Freq: Once | RESPIRATORY_TRACT | Status: DC

## 2012-11-03 MED ORDER — IPRATROPIUM BROMIDE 0.02 % IN SOLN
0.5000 mg | Freq: Once | RESPIRATORY_TRACT | Status: AC
  Administered 2012-11-03: 0.5 mg via RESPIRATORY_TRACT
  Filled 2012-11-03: qty 2.5

## 2012-11-03 MED ORDER — INFLUENZA VAC SPLIT QUAD 0.5 ML IM SUSP
0.5000 mL | INTRAMUSCULAR | Status: AC
  Administered 2012-11-04: 0.5 mL via INTRAMUSCULAR
  Filled 2012-11-03: qty 0.5

## 2012-11-03 MED ORDER — SODIUM CHLORIDE 0.9 % IJ SOLN
3.0000 mL | Freq: Two times a day (BID) | INTRAMUSCULAR | Status: DC
Start: 2012-11-03 — End: 2012-11-07
  Administered 2012-11-03 – 2012-11-06 (×4): 3 mL via INTRAVENOUS

## 2012-11-03 MED ORDER — METHYLPREDNISOLONE SODIUM SUCC 125 MG IJ SOLR
125.0000 mg | Freq: Once | INTRAMUSCULAR | Status: AC
  Administered 2012-11-03: 125 mg via INTRAVENOUS
  Filled 2012-11-03: qty 2

## 2012-11-03 MED ORDER — SODIUM CHLORIDE 0.9 % IV SOLN
INTRAVENOUS | Status: DC
Start: 2012-11-03 — End: 2012-11-07
  Administered 2012-11-03: 1000 mL via INTRAVENOUS
  Administered 2012-11-03 – 2012-11-04 (×2): via INTRAVENOUS

## 2012-11-03 MED ORDER — PROPOFOL 10 MG/ML IV EMUL
INTRAVENOUS | Status: AC
  Filled 2012-11-03: qty 100

## 2012-11-03 MED ORDER — METHYLPREDNISOLONE SODIUM SUCC 125 MG IJ SOLR
80.0000 mg | Freq: Four times a day (QID) | INTRAMUSCULAR | Status: DC
  Administered 2012-11-03 – 2012-11-06 (×13): 80 mg via INTRAVENOUS
  Filled 2012-11-03 (×13): qty 2

## 2012-11-03 MED ORDER — ENOXAPARIN SODIUM 40 MG/0.4ML ~~LOC~~ SOLN
40.0000 mg | SUBCUTANEOUS | Status: DC
  Administered 2012-11-03 – 2012-11-06 (×4): 40 mg via SUBCUTANEOUS
  Filled 2012-11-03 (×4): qty 0.4

## 2012-11-03 MED ORDER — SODIUM CHLORIDE 0.9 % IV SOLN
Freq: Once | INTRAVENOUS | Status: AC
  Administered 2012-11-03: 500 mL via INTRAVENOUS
  Administered 2012-11-03: 20 mL/h via INTRAVENOUS

## 2012-11-03 MED ORDER — ACETAMINOPHEN 650 MG RE SUPP: 650.0000 mg | Freq: Four times a day (QID) | RECTAL | Status: DC | PRN

## 2012-11-03 MED ORDER — PROPOFOL 10 MG/ML IV EMUL
INTRAVENOUS | Status: AC
  Administered 2012-11-03: 2 mL via INTRAVENOUS
  Filled 2012-11-03: qty 100

## 2012-11-03 MED ORDER — PROPOFOL 10 MG/ML IV EMUL
5.0000 ug/kg/min | INTRAVENOUS | Status: DC
  Administered 2012-11-03: 40 ug/kg/min via INTRAVENOUS
  Administered 2012-11-03: 48.692 ug/kg/min via INTRAVENOUS
  Administered 2012-11-03: 40 ug/kg/min via INTRAVENOUS
  Administered 2012-11-03: 50 ug/kg/min via INTRAVENOUS
  Administered 2012-11-03: 48.692 ug/kg/min via INTRAVENOUS
  Administered 2012-11-03 (×2): 50 ug/kg/min via INTRAVENOUS
  Administered 2012-11-04: 40.067 ug/kg/min via INTRAVENOUS
  Administered 2012-11-04 (×2): 40 ug/kg/min via INTRAVENOUS
  Filled 2012-11-03 (×10): qty 100

## 2012-11-03 MED ORDER — LEVOFLOXACIN IN D5W 500 MG/100ML IV SOLN
INTRAVENOUS | Status: AC
  Filled 2012-11-03: qty 100

## 2012-11-03 MED ORDER — LEVOFLOXACIN IN D5W 500 MG/100ML IV SOLN
500.0000 mg | INTRAVENOUS | Status: DC
  Administered 2012-11-03 – 2012-11-05 (×3): 500 mg via INTRAVENOUS
  Filled 2012-11-03 (×3): qty 100

## 2012-11-03 MED ORDER — FAMOTIDINE IN NACL 20-0.9 MG/50ML-% IV SOLN
INTRAVENOUS | Status: AC
  Filled 2012-11-03: qty 50

## 2012-11-03 MED ORDER — IPRATROPIUM BROMIDE 0.02 % IN SOLN
0.5000 mg | RESPIRATORY_TRACT | Status: DC
  Administered 2012-11-03 – 2012-11-05 (×13): 0.5 mg via RESPIRATORY_TRACT
  Filled 2012-11-03 (×13): qty 2.5

## 2012-11-03 MED ORDER — ONDANSETRON HCL 4 MG/2ML IJ SOLN: 4.0000 mg | Freq: Four times a day (QID) | INTRAMUSCULAR | Status: DC | PRN

## 2012-11-03 MED ORDER — ALBUTEROL SULFATE (5 MG/ML) 0.5% IN NEBU
5.0000 mg | INHALATION_SOLUTION | Freq: Once | RESPIRATORY_TRACT | Status: AC
  Administered 2012-11-03: 5 mg via RESPIRATORY_TRACT
  Filled 2012-11-03: qty 1

## 2012-11-03 NOTE — ED Notes (Signed)
Propofol infusion increased at intervals up to 58mcg/kg/hr due to patient restlessness.  Order obtained for pain medication verbal from Dr Dierdre Highman
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
## 2012-11-03 NOTE — ED Notes (Signed)
Transferred with 3 staff including RT via stretcher to room #11  ICU

## 2012-11-03 NOTE — ED Notes (Signed)
  Mother Vincent Gros PH# [phone]    Dr Rito Ehrlich has informed her of patient admission to ICU, she visited with him briefly prior to returning to her home.  Note: Patient lives with his mother.

## 2012-11-03 NOTE — ED Notes (Signed)
Per mom: pt lives with her but she is a poor medical historian. Does state that he has been using a nebulizer "nonstop" for the past few days. Tried to call Dr. Juanetta Gosling this w/e but he's OOT, was planning on seeing him tomorrow. Tonight woke her up to say he couldn't breath, brought here by her POV.

## 2012-11-03 NOTE — ED Notes (Signed)
Moved to CT for scan of head.  V/s stable

## 2012-11-03 NOTE — ED Notes (Signed)
Propofol decreased to 25ml or 34.7 mcg/Kg/min

## 2012-11-03 NOTE — ED Notes (Signed)
Bolus of propofol to maintain patient movement when attempting to obtain additional labs.

## 2012-11-03 NOTE — ED Notes (Signed)
Rapid sequence intubation performed by Dr Dierdre Highman after administration of  Etomidate 20 mg and Succinycholine 120 mg IV push  Given by Jeannette How, RN   IV right hand

## 2012-11-03 NOTE — Plan of Care (Signed)
Problem: Phase I Progression Outcomes Goal: VTE prophylaxis Outcome: Completed/Met Date Met:  11/03/12 SCD's, Ted hose, and lovenox Goal: GIProphysixis Outcome: Completed/Met Date Met:  11/03/12 Pepcid IV
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
## 2012-11-03 NOTE — ED Notes (Signed)
Upon arrival to room 5, pt able to get out of w/c to stretcher, collasped, agonal resp, began bagging pt. Moved to rm 1 with Dr. Dierdre Highman

## 2012-11-03 NOTE — Progress Notes (Signed)
Subjective: Patient was admitted to ICU last night due to respiratory failure secondary to COPD. He had woresing shortness of breath over the last one week duration.  Objective: Vital signs in last 24 hours: Temp:  [97.2 F (36.2 C)-98.1 F (36.7 C)] 97.9 F (36.6 C) (10/13 0736) Pulse Rate:  [86-112] 86 (10/13 0255) Resp:  [11-28] 16 (10/13 0630) BP: (80-116)/(52-79) 104/68 mmHg (10/13 0630) SpO2:  [92 %-100 %] 96 % (10/13 0704) FiO2 (%):  [50 %-100 %] 50 % (10/13 0704) Weight:  [119.8 kg (264 lb 1.8 oz)] 119.8 kg (264 lb 1.8 oz) (10/13 0320) Weight change:  Last BM Date:  (unsure)  Intake/Output from previous day: 10/12 0701 - 10/13 0700 In: 603.5 [I.V.:453.5; IV Piggyback:150] Out: 450 [Urine:450]  PHYSICAL EXAM General appearance: slowed mentation Resp: diminished breath sounds bilaterally Cardio: S1, S2 normal GI: soft, non-tender; bowel sounds normal; no masses,  no organomegaly Extremities: extremities normal, atraumatic, no cyanosis or edema  Lab Results:    @labtest @ ABGS  Recent Labs  11/03/12 0552  PHART 7.378  PO2ART 203.0*  TCO2 19.1  HCO3 21.6   CULTURES Recent Results (from the past 240 hour(s))  CULTURE, BLOOD (ROUTINE X 2)     Status: None   Collection Time    11/03/12 12:48 AM      Result Value Range Status   Specimen Description Blood BLOOD LEFT HAND   Final   Special Requests BOTTLES DRAWN AEROBIC AND ANAEROBIC 5CC   Final   Culture PENDING   Incomplete   Report Status PENDING   Incomplete  CULTURE, BLOOD (ROUTINE X 2)     Status: None   Collection Time    11/03/12 12:55 AM      Result Value Range Status   Specimen Description Blood LEFT ANTECUBITAL   Final   Special Requests BOTTLES DRAWN AEROBIC AND ANAEROBIC 6CC   Final   Culture PENDING   Incomplete   Report Status PENDING   Incomplete  MRSA PCR SCREENING     Status: None   Collection Time    11/03/12  2:58 AM      Result Value Range Status   MRSA by PCR NEGATIVE  NEGATIVE  Final   Comment:            The GeneXpert MRSA Assay (FDA     approved for NASAL specimens     only), is one component of a     comprehensive MRSA colonization     surveillance program. It is not     intended to diagnose MRSA     infection nor to guide or     monitor treatment for     MRSA infections.   Studies/Results: Ct Head Wo Contrast  11/03/2012   CLINICAL DATA:  Altered mental status.  Collapsed and unresponsive.  EXAM: CT HEAD WITHOUT CONTRAST  TECHNIQUE: Contiguous axial images were obtained from the base of the skull through the vertex without intravenous contrast.  COMPARISON:  None.  FINDINGS: Skull:No acute osseous abnormality. No lytic or blastic lesion.  Orbits: No acute abnormality.  Sinuses: Scattered inflammatory mucosal thickening. Nasopharyngeal fluid related to intubation.  Brain: No definitive evidence of acute abnormality, such as acute gray matter infarction, hemorrhage, hydrocephalus, or mass lesion/mass effect. Relatively ill-defined low attenuation in the posterior limb left internal capsule.  IMPRESSION: 1. No evidence of acute abnormality, including hemorrhage or large territory ischemia. 2. Nonspecific white matter low-attenuation in the left internal capsule, often related to chronic small  vessel ischemia.   Electronically Signed   By: Tiburcio Pea M.D.   On: 11/03/2012 03:19   Dg Chest Portable 1 View  11/03/2012   *RADIOLOGY REPORT*  Clinical Data: Bronchitis, neck surgery.  PORTABLE CHEST - 1 VIEW  Comparison: Chest radiograph June 23, 2012.  Findings: Cardiomediastinal silhouette is unremarkable for this low inspiratory portable examination with crowded vasculature markings. The lungs are clear without pleural effusions or focal consolidations.  Trachea projects midline and there is no pneumothorax.  Endotracheal tube tip projects 2.2 cm above the carina. Multiple EKG lines overlay the patient and could obscure underlying subtle pathology.  Suspected  subcutaneous gas in the right neck.  IMPRESSION: No acute cardiopulmonary process.  Endotracheal tube tip projects 2.3 cm above the carina.  Suspected right neck subcutaneous gas, recommend correlation with date of reported neck surgery.   Original Report Authenticated By: Awilda Metro    Medications: I have reviewed the patient's current medications.  Assesment: Principal Problem:   Acute respiratory failure Active Problems:   COPD with acute exacerbation   Schizophrenia   Hypotension    Plan: Continue ventilatory support Continue IV steroids and Iv antibiotics Pulmonary consult appreciated Will monitor CB/BMP and ABG     LOS: 0 days   Ashaun Gaughan 11/03/2012, 8:12 AM

## 2012-11-03 NOTE — ED Provider Notes (Signed)
CSN: [id number]     Arrival date & time 11/02/12  2357 History   First MD Initiated Contact with Patient 11/03/12 0001    Scribed for [name], MD, the patient was seen in room APA01/APA01. This chart was scribed by [name], ED scribe. Patient's care was started at 12:02 AM  No chief complaint on file.  (Consider location/radiation/quality/duration/timing/severity/associated sxs/prior Treatment) The history is provided by a relative. The history is limited by the condition of the patient. No language interpreter was used.  Level 5 caveat applies secondary to acuity of condition.   HPI Comments: Terry [name] is a 57 y.o. male who presents to the Emergency Department complaining with shortness of breath onset PTA per mother. Mother reports pt taking normal schizophrenia medications before bed and started complaining of difficulty breathing. Pt was awake and conversant on arrival, when brought back to bed 4 became unresponsive and was emergently intubated for respiratory failure.  Per mom pt has been "sucking on a nebulizer for the past 3 days" and denies fever.  Reports quit smoking 7 years ago. Denies PMHx of COPD, and seizures.    Past Medical History  Diagnosis Date  . Bronchitis   . Schizophrenia    Past Surgical History  Procedure Laterality Date  . Neck surgery     Family History  Problem Relation Age of Onset  . Diabetes Father    History  Substance Use Topics  . Smoking status: Former Games developer  . Smokeless tobacco: Not on file  . Alcohol Use: No    Review of Systems  Unable to perform ROS: Acuity of condition    Allergies  Review of patient's allergies indicates no known allergies.  Home Medications   Current Outpatient Rx  Name  Route  Sig  Dispense  Refill  . albuterol (PROVENTIL) (2.5 MG/3ML) 0.083% nebulizer solution   Nebulization   Take 2.5 mg by nebulization every 4 (four) hours as needed for wheezing or shortness of breath.         Marland Kitchen  albuterol (PROVENTIL) (2.5 MG/3ML) 0.083% nebulizer solution   Nebulization   Take 3 mLs (2.5 mg total) by nebulization every 6 (six) hours as needed for wheezing.   75 mL   12   . amLODipine (NORVASC) 5 MG tablet   Oral   Take 5 mg by mouth daily.         . benztropine (COGENTIN) 1 MG tablet   Oral   Take 1 mg by mouth at bedtime.         . predniSONE (DELTASONE) 20 MG tablet   Oral   Take 20 mg by mouth daily. Tapered dose(started on 06/26/2012)         . predniSONE (DELTASONE) 50 MG tablet   Oral   Take 1 tablet (50 mg total) by mouth daily.   5 tablet   0   . ziprasidone (GEODON) 80 MG capsule   Oral   Take 80 mg by mouth at bedtime.          There were no vitals taken for this visit. Physical Exam  Nursing note and vitals reviewed. Constitutional: He is oriented to person, place, and time. He appears well-developed and well-nourished. He appears distressed.  HENT:  Head: Normocephalic and atraumatic.  Eyes: Conjunctivae and EOM are normal.  Right eye is deviated lateral and left eye is normal    Neck: Neck supple. No tracheal deviation present.  Well-healed trach scar on neck  Cardiovascular: Normal rate.   Pulmonary/Chest: Effort normal. No respiratory distress.  Tight breath sounds and not moving air   Abdominal:  Well-healed abdominal surgical scar  Musculoskeletal: Normal range of motion.  Neurological: He is alert and oriented to person, place, and time.  Follows very limited commands, can take a deep breath, but is essentially non-verbal   Skin: Skin is warm. He is diaphoretic.  Warm and perfusing, diaphoretic     ED Course  INTUBATION Date/Time: 11/03/2012 12:25 AM Performed by: [name] Authorized by: [name] Consent: The procedure was performed in an emergent situation. Patient understanding: patient states understanding of the procedure being performed Patient consent: the patient's understanding of the procedure matches  consent given Procedure consent: procedure consent matches procedure scheduled Required items: required blood products, implants, devices, and special equipment available Patient identity confirmed: arm band Time out: Immediately prior to procedure a "time out" was called to verify the correct patient, procedure, equipment, support staff and site/side marked as required. Indications: respiratory failure and airway protection Intubation method: video-assisted Patient status: paralyzed (RSI) Sedatives: etomidate Paralytic: succinylcholine Laryngoscope size: Mac 4 Tube size: 8.0 mm Tube type: cuffed Number of attempts: 1 Post-procedure assessment: chest rise and CO2 detector Breath sounds: equal Cuff inflated: yes ETT to lip: 25 cm Tube secured with: ETT holder Patient tolerance: Patient tolerated the procedure well with no immediate complications.   (including critical care time)  CRITICAL CARE Performed by: [name], MD Total critical care time: 4 Critical care time was exclusive of separately billable procedures and treating other patients. Critical care was necessary to treat or prevent imminent or life-threatening deterioration. Critical care was time spent personally by me on the following activities: development of treatment plan with patient and/or surrogate as well as nursing, discussions with consultants, evaluation of patient's response to treatment, examination of patient, obtaining history from patient or surrogate, ordering and performing treatments and interventions, ordering and review of laboratory studies, ordering and review of radiographic studies, pulse oximetry and re-evaluation of patient's condition. Vent management, sedation, IV steroids ? COPD exacerbation, albuterol nebs, PCCM and MED consulted.   Labs Review Labs Reviewed  CBC - Abnormal; Notable for the following:    WBC 15.3 (*)    All other components within normal limits  COMPREHENSIVE METABOLIC  PANEL - Abnormal; Notable for the following:    Glucose, Bld 224 (*)    Creatinine, Ser 1.55 (*)    Alkaline Phosphatase 128 (*)    GFR calc non Af Amer 48 (*)    GFR calc Af Amer 56 (*)    All other components within normal limits  LACTIC ACID, PLASMA - Abnormal; Notable for the following:    Lactic Acid, Venous 3.2 (*)    All other components within normal limits  BLOOD GAS, ARTERIAL - Abnormal; Notable for the following:    pH, Arterial 7.246 (*)    pCO2 arterial 53.4 (*)    pO2, Arterial 499.0 (*)    Acid-base deficit 3.8 (*)    All other components within normal limits  POCT I-STAT, CHEM 8 - Abnormal; Notable for the following:    Creatinine, Ser 1.40 (*)    Glucose, Bld 217 (*)    All other components within normal limits  CULTURE, BLOOD (ROUTINE X 2)  CULTURE, BLOOD (ROUTINE X 2)  PRO B NATRIURETIC PEPTIDE  URINALYSIS, ROUTINE W REFLEX MICROSCOPIC  POCT I-STAT TROPONIN I   Imaging Review Dg Chest Portable 1 View  11/03/2012   *  RADIOLOGY REPORT*  Clinical Data: Bronchitis, neck surgery.  PORTABLE CHEST - 1 VIEW  Comparison: Chest radiograph June 23, 2012.  Findings: Cardiomediastinal silhouette is unremarkable for this low inspiratory portable examination with crowded vasculature markings. The lungs are clear without pleural effusions or focal consolidations.  Trachea projects midline and there is no pneumothorax.  Endotracheal tube tip projects 2.2 cm above the carina. Multiple EKG lines overlay the patient and could obscure underlying subtle pathology.  Suspected subcutaneous gas in the right neck.  IMPRESSION: No acute cardiopulmonary process.  Endotracheal tube tip projects 2.3 cm above the carina.  Suspected right neck subcutaneous gas, recommend correlation with date of reported neck surgery.   Original Report Authenticated By: Awilda Metro    EKG Interpretation     Ventricular Rate:  105 PR Interval:  146 QRS Duration: 84 QT Interval:  370 QTC  Calculation: 489 R Axis:   48 Text Interpretation:  Sinus tachycardia with occasional Premature ventricular complexes Nonspecific ST abnormality No significant change since last tracing           12:54 AM d/w PCCM Dr Molli Knock, recs admit [facility]. Dr Rito Ehrlich to admit.   MDM  DX: Resp Failure h/o COPD Intubated emergently CXR, labs, ECG ICU admit  I personally performed the services described in this documentation, which was scribed in my presence. The recorded information has been reviewed and is accurate.     [name], MD 11/03/12 [phone]

## 2012-11-03 NOTE — Progress Notes (Signed)
*  PRELIMINARY RESULTS* Echocardiogram 2D Echocardiogram has been performed.  Conrad Beaumont 11/03/2012, 5:00 PM

## 2012-11-03 NOTE — Progress Notes (Signed)
Inpatient Diabetes Program Recommendations  AACE/ADA: New Consensus Statement on Inpatient Glycemic Control (2013)  Target Ranges:  Prepandial:   less than 140 mg/dL      Peak postprandial:   less than 180 mg/dL (1-2 hours)      Critically ill patients:  140 - 180 mg/dL   Results for DAMIANO, STAMPER (MRN [id number]) as of 11/03/2012 08:39  Ref. Range 11/03/2012 00:10 11/03/2012 01:05 11/03/2012 04:35  Glucose Latest Range: 70-99 mg/dL 409 (H) 811 (H) 914 (H)    Inpatient Diabetes Program Recommendations Correction (SSI): Please consider ordering CBGs Q4H with Novolog correction while inpatient and on steroids. A1C:  Please consider ordering an A1C to determine glycemic control over the past 2-3 months.  Note: Patient does not have a documented history of diabetes. Initial lab glucose was noted to be 224 mg/dl. Currently patient is ordered to received Solumedrol 80 mg Q6H.  Please consider ordering CBGs Q4H with Novolog correction and ordering an A1C.  Will continue to follow.  Thanks, Orlando Penner, RN, MSN, CCRN Diabetes Coordinator Inpatient Diabetes Program [phone] (Team Pager) [phone] ([facility] office) [phone] [facility] office)

## 2012-11-03 NOTE — Consult Note (Addendum)
Consult requested by: Dr. Felecia Shelling Consult requested for respiratory failure:  HPI: This is a 57 year old African American male who has a history of COPD. He's been increasingly short of breath for several days has been using nebulizer treatments and inhalers. He got much worse came to the emergency department where he was found to be in respiratory distress and was urgently intubated. This history is from the medical record because no family available and he is intubated and sedated. He has a history of schizophrenia.  Past Medical History  Diagnosis Date  . Bronchitis   . Schizophrenia   . Shortness of breath 11/03/12    Intubated  . COPD (chronic obstructive pulmonary disease)      Family History  Problem Relation Age of Onset  . Diabetes Father      History   Social History  . Marital Status: Widowed    Spouse Name: N/A    Number of Children: N/A  . Years of Education: N/A   Social History Main Topics  . Smoking status: Former Games developer  . Smokeless tobacco: None  . Alcohol Use: No  . Drug Use: No  . Sexual Activity: None   Other Topics Concern  . None   Social History Narrative  . None     ROS: Unobtainable    Objective: Vital signs in last 24 hours: Temp:  [97.2 F (36.2 C)-98.1 F (36.7 C)] 97.9 F (36.6 C) (10/13 0736) Pulse Rate:  [86-112] 86 (10/13 0255) Resp:  [11-28] 16 (10/13 0630) BP: (80-116)/(52-79) 104/68 mmHg (10/13 0630) SpO2:  [92 %-100 %] 96 % (10/13 0704) FiO2 (%):  [50 %-100 %] 50 % (10/13 0704) Weight:  [119.8 kg (264 lb 1.8 oz)] 119.8 kg (264 lb 1.8 oz) (10/13 0320) Weight change:  Last BM Date:  (unsure)  Intake/Output from previous day: 10/12 0701 - 10/13 0700 In: 603.5 [I.V.:453.5; IV Piggyback:150] Out: 450 [Urine:450]  PHYSICAL EXAM He is an obese male who is intubated and on a ventilator. He is still sedated. Pupils are reactive. His neck is supple. He does not have any JVD. His chest shows rhonchi bilaterally. His heart is  regular without gallop. His abdomen is soft obese without masses. He does not have peripheral edema. Central nervous system examination shows he can move all 4 extremities but he is sedated.  Lab Results: Basic Metabolic Panel:  Recent Labs  32/44/01 0010 11/03/12 0105 11/03/12 0435  NA 136 141 141  K 3.7 3.9 4.0  CL 98 106 107  CO2 23  --  23  GLUCOSE 224* 217* 152*  BUN 18 23 17   CREATININE 1.55* 1.40* 1.34  CALCIUM 9.1  --  8.8   Liver Function Tests:  Recent Labs  11/03/12 0010 11/03/12 0435  AST 30 46*  ALT 25 38  ALKPHOS 128* 118*  BILITOT 0.4 0.6  PROT 7.6 7.3  ALBUMIN 3.6 3.5   No results found for this basename: LIPASE, AMYLASE,  in the last 72 hours No results found for this basename: AMMONIA,  in the last 72 hours CBC:  Recent Labs  11/03/12 0010 11/03/12 0105 11/03/12 0435  WBC 15.3*  --  11.9*  HGB 15.0 17.0 14.5  HCT 46.8 50.0 44.7  MCV 86.8  --  84.3  PLT 205  --  172   Cardiac Enzymes:  Recent Labs  11/03/12 0446  TROPONINI 0.53*   BNP:  Recent Labs  11/03/12 0010  PROBNP 71.0   D-Dimer: No results found for  this basename: DDIMER,  in the last 72 hours CBG:  Recent Labs  11/03/12 0456  GLUCAP 129*   Hemoglobin A1C: No results found for this basename: HGBA1C,  in the last 72 hours Fasting Lipid Panel: No results found for this basename: CHOL, HDL, LDLCALC, TRIG, CHOLHDL, LDLDIRECT,  in the last 72 hours Thyroid Function Tests: No results found for this basename: TSH, T4TOTAL, FREET4, T3FREE, THYROIDAB,  in the last 72 hours Anemia Panel: No results found for this basename: VITAMINB12, FOLATE, FERRITIN, TIBC, IRON, RETICCTPCT,  in the last 72 hours Coagulation: No results found for this basename: LABPROT, INR,  in the last 72 hours Urine Drug Screen: Drugs of Abuse     Component Value Date/Time   LABOPIA NONE DETECTED 06/23/2012 0531   COCAINSCRNUR NONE DETECTED 06/23/2012 0531   LABBENZ NONE DETECTED 06/23/2012 0531    AMPHETMU NONE DETECTED 06/23/2012 0531   THCU NONE DETECTED 06/23/2012 0531   LABBARB NONE DETECTED 06/23/2012 0531    Alcohol Level: No results found for this basename: ETH,  in the last 72 hours Urinalysis:  Recent Labs  11/03/12 0156  COLORURINE YELLOW  LABSPEC >1.030*  PHURINE 5.5  GLUCOSEU 250*  HGBUR LARGE*  BILIRUBINUR NEGATIVE  KETONESUR NEGATIVE  PROTEINUR 30*  UROBILINOGEN 0.2  NITRITE NEGATIVE  LEUKOCYTESUR NEGATIVE   Misc. Labs:   ABGS:  Recent Labs  11/03/12 0552  PHART 7.378  PO2ART 203.0*  TCO2 19.1  HCO3 21.6     MICROBIOLOGY: Recent Results (from the past 240 hour(s))  CULTURE, BLOOD (ROUTINE X 2)     Status: None   Collection Time    11/03/12 12:48 AM      Result Value Range Status   Specimen Description Blood BLOOD LEFT HAND   Final   Special Requests BOTTLES DRAWN AEROBIC AND ANAEROBIC 5CC   Final   Culture PENDING   Incomplete   Report Status PENDING   Incomplete  CULTURE, BLOOD (ROUTINE X 2)     Status: None   Collection Time    11/03/12 12:55 AM      Result Value Range Status   Specimen Description Blood LEFT ANTECUBITAL   Final   Special Requests BOTTLES DRAWN AEROBIC AND ANAEROBIC 6CC   Final   Culture PENDING   Incomplete   Report Status PENDING   Incomplete  MRSA PCR SCREENING     Status: None   Collection Time    11/03/12  2:58 AM      Result Value Range Status   MRSA by PCR NEGATIVE  NEGATIVE Final   Comment:            The GeneXpert MRSA Assay (FDA     approved for NASAL specimens     only), is one component of a     comprehensive MRSA colonization     surveillance program. It is not     intended to diagnose MRSA     infection nor to guide or     monitor treatment for     MRSA infections.    Studies/Results: Ct Head Wo Contrast  11/03/2012   CLINICAL DATA:  Altered mental status.  Collapsed and unresponsive.  EXAM: CT HEAD WITHOUT CONTRAST  TECHNIQUE: Contiguous axial images were obtained from the base of the skull  through the vertex without intravenous contrast.  COMPARISON:  None.  FINDINGS: Skull:No acute osseous abnormality. No lytic or blastic lesion.  Orbits: No acute abnormality.  Sinuses: Scattered inflammatory mucosal thickening. Nasopharyngeal fluid  related to intubation.  Brain: No definitive evidence of acute abnormality, such as acute gray matter infarction, hemorrhage, hydrocephalus, or mass lesion/mass effect. Relatively ill-defined low attenuation in the posterior limb left internal capsule.  IMPRESSION: 1. No evidence of acute abnormality, including hemorrhage or large territory ischemia. 2. Nonspecific white matter low-attenuation in the left internal capsule, often related to chronic small vessel ischemia.   Electronically Signed   By: Tiburcio Pea M.D.   On: 11/03/2012 03:19   Dg Chest Portable 1 View  11/03/2012   *RADIOLOGY REPORT*  Clinical Data: Bronchitis, neck surgery.  PORTABLE CHEST - 1 VIEW  Comparison: Chest radiograph June 23, 2012.  Findings: Cardiomediastinal silhouette is unremarkable for this low inspiratory portable examination with crowded vasculature markings. The lungs are clear without pleural effusions or focal consolidations.  Trachea projects midline and there is no pneumothorax.  Endotracheal tube tip projects 2.2 cm above the carina. Multiple EKG lines overlay the patient and could obscure underlying subtle pathology.  Suspected subcutaneous gas in the right neck.  IMPRESSION: No acute cardiopulmonary process.  Endotracheal tube tip projects 2.3 cm above the carina.  Suspected right neck subcutaneous gas, recommend correlation with date of reported neck surgery.   Original Report Authenticated By: Awilda Metro    Medications:  Prior to Admission:  Prescriptions prior to admission  Medication Sig Dispense Refill  . albuterol (PROVENTIL) (2.5 MG/3ML) 0.083% nebulizer solution Take 2.5 mg by nebulization every 4 (four) hours as needed for wheezing or shortness of  breath.      Marland Kitchen albuterol (PROVENTIL) (2.5 MG/3ML) 0.083% nebulizer solution Take 3 mLs (2.5 mg total) by nebulization every 6 (six) hours as needed for wheezing.  75 mL  12  . amLODipine (NORVASC) 5 MG tablet Take 5 mg by mouth daily.      . benztropine (COGENTIN) 1 MG tablet Take 1 mg by mouth at bedtime.      . predniSONE (DELTASONE) 20 MG tablet Take 20 mg by mouth daily. Tapered dose(started on 06/26/2012)      . predniSONE (DELTASONE) 50 MG tablet Take 1 tablet (50 mg total) by mouth daily.  5 tablet  0  . ziprasidone (GEODON) 80 MG capsule Take 80 mg by mouth at bedtime.       Scheduled: . albuterol  2.5 mg Nebulization Q4H  . antiseptic oral rinse  15 mL Mouth Rinse QID  . chlorhexidine  15 mL Mouth Rinse BID  . enoxaparin (LOVENOX) injection  40 mg Subcutaneous Q24H  . famotidine (PEPCID) IV  20 mg Intravenous Q12H  . fentaNYL      . ipratropium  0.5 mg Nebulization Q4H  . levofloxacin (LEVAQUIN) IV  500 mg Intravenous Q24H  . methylPREDNISolone (SOLU-MEDROL) injection  80 mg Intravenous Q6H  . propofol      . sodium chloride  3 mL Intravenous Q12H   Continuous: . sodium chloride 100 mL/hr at 11/03/12 0628  . fentaNYL infusion INTRAVENOUS    . propofol 48.692 mcg/kg/min (11/03/12 0539)   ZOX:[id number], acetaminophen, albuterol, fentaNYL, ondansetron (ZOFRAN) IV, ondansetron  Assesment: He has acute respiratory failure. He is being treated for COPD with acute exacerbation. There is some question of subcutaneous gas in the right side of his neck so chest x-ray will need to be repeated. He has had elevated white blood cell count and probably has pneumonia that we may not see on chest x-ray yet. He is being treated appropriately. He has elevated troponin Principal Problem:   Acute respiratory  failure Active Problems:   COPD with acute exacerbation   Schizophrenia   Hypotension    Plan: Continue ventilator support.  He is set for soft tissue of neck film. Repeat CXR     LOS: 0 days   Syleena Mchan L 11/03/2012, 7:48 AM

## 2012-11-03 NOTE — ED Notes (Signed)
Patient presented to ER with c/o difficulty breathing.  Patient was unable to speak and was tachypneic on arrival.  Patient had diminished breath sounds.  Dr. Dierdre Highman in room; decided to intubate patient for airway management and respiratory failure.  Patient moved to Room 1.

## 2012-11-03 NOTE — Progress Notes (Signed)
UR chart review completed.  

## 2012-11-03 NOTE — ED Notes (Signed)
Late entry Note: right eye deviated to right.  Left eye is central or midline gaze.  Mother states this is not his normal gaze. " Reports sometimes when he is on Geodon his eyes roll back in his head"

## 2012-11-04 ENCOUNTER — Inpatient Hospital Stay (HOSPITAL_COMMUNITY): Payer: PRIVATE HEALTH INSURANCE

## 2012-11-04 LAB — BLOOD GAS, ARTERIAL
Acid-base deficit: 1.8 mmol/L (ref 0.0–2.0)
Bicarbonate: 21.5 mEq/L (ref 20.0–24.0)
FIO2: 50 %
FIO2: 40 %
Mode: POSITIVE
O2 Saturation: 99.3 %
O2 Saturation: 99 %
PEEP: 5 cmH2O
PEEP: 5 cmH2O
Patient temperature: 37
Patient temperature: 37
Pressure support: 5 cmH2O
RATE: 16 resp/min
TCO2: 18.8 mmol/L (ref 0–100)
pCO2 arterial: 31 mmHg — ABNORMAL LOW (ref 35.0–45.0)
pCO2 arterial: 36.2 mmHg (ref 35.0–45.0)

## 2012-11-04 LAB — BASIC METABOLIC PANEL
CO2: 22 mEq/L (ref 19–32)
Calcium: 9.1 mg/dL (ref 8.4–10.5)
GFR calc Af Amer: 84 mL/min — ABNORMAL LOW (ref >90)
GFR calc non Af Amer: 72 mL/min — ABNORMAL LOW (ref >90)
Glucose, Bld: 170 mg/dL — ABNORMAL HIGH (ref 70–99)
Potassium: 3.9 mEq/L (ref 3.5–5.1)
Sodium: 139 mEq/L (ref 135–145)

## 2012-11-04 LAB — URINE CULTURE: Colony Count: NO GROWTH

## 2012-11-04 LAB — CBC
Hemoglobin: 14.3 g/dL (ref 13.0–17.0)
MCH: 27.6 pg (ref 26.0–34.0)
MCV: 83 fL (ref 78.0–100.0)
RBC: 5.19 MIL/uL (ref 4.22–5.81)

## 2012-11-04 LAB — GLUCOSE, CAPILLARY
Glucose-Capillary: 169 mg/dL — ABNORMAL HIGH (ref 70–99)
Glucose-Capillary: 137 mg/dL — ABNORMAL HIGH (ref 70–99)

## 2012-11-04 MED ORDER — FENTANYL CITRATE 0.05 MG/ML IJ SOLN
INTRAMUSCULAR | Status: AC
  Filled 2012-11-04: qty 50

## 2012-11-04 MED ORDER — ZIPRASIDONE HCL 80 MG PO CAPS
80.0000 mg | ORAL_CAPSULE | Freq: Every day | ORAL | Status: DC
  Administered 2012-11-04 – 2012-11-06 (×3): 80 mg via ORAL
  Filled 2012-11-04 (×3): qty 1

## 2012-11-04 MED ORDER — BENZTROPINE MESYLATE 1 MG PO TABS
1.0000 mg | ORAL_TABLET | Freq: Every day | ORAL | Status: DC
  Administered 2012-11-04 – 2012-11-06 (×3): 1 mg via ORAL
  Filled 2012-11-04 (×3): qty 1

## 2012-11-04 MED ORDER — AMLODIPINE BESYLATE 5 MG PO TABS
5.0000 mg | ORAL_TABLET | Freq: Every day | ORAL | Status: DC
  Administered 2012-11-04 – 2012-11-06 (×3): 5 mg via ORAL
  Filled 2012-11-04 (×3): qty 1

## 2012-11-04 NOTE — Progress Notes (Signed)
Subjective: Patient is orally intubated and sedated. He is receiving IV steroid and iv antibiotics. No fever or chills.  Objective: Vital signs in last 24 hours: Temp:  [98.2 F (36.8 C)-99.9 F (37.7 C)] 98.3 F (36.8 C) (10/14 0400) Resp:  [14-22] 16 (10/14 0700) BP: (90-126)/(40-112) 105/50 mmHg (10/14 0700) SpO2:  [95 %-98 %] 97 % (10/14 0704) FiO2 (%):  [50 %] 50 % (10/14 0704) Weight change:  Last BM Date:  (unsure)  Intake/Output from previous day: 10/13 0701 - 10/14 0700 In: 2957 [I.V.:2757; IV Piggyback:200] Out: 4725 [Urine:4725]  PHYSICAL EXAM General appearance: slowed mentation Resp: diminished breath sounds bilaterally Cardio: S1, S2 normal GI: soft, non-tender; bowel sounds normal; no masses,  no organomegaly Extremities: extremities normal, atraumatic, no cyanosis or edema  Lab Results:    @labtest @ ABGS  Recent Labs  11/04/12 0515  PHART 7.455*  PO2ART 184.0*  TCO2 18.8  HCO3 21.5   CULTURES Recent Results (from the past 240 hour(s))  CULTURE, BLOOD (ROUTINE X 2)     Status: None   Collection Time    11/03/12 12:48 AM      Result Value Range Status   Specimen Description Blood BLOOD LEFT HAND   Final   Special Requests BOTTLES DRAWN AEROBIC AND ANAEROBIC 5CC   Final   Culture NO GROWTH <24 HRS   Final   Report Status PENDING   Incomplete  CULTURE, BLOOD (ROUTINE X 2)     Status: None   Collection Time    11/03/12 12:55 AM      Result Value Range Status   Specimen Description Blood LEFT ANTECUBITAL   Final   Special Requests BOTTLES DRAWN AEROBIC AND ANAEROBIC 6CC   Final   Culture NO GROWTH <24 HRS   Final   Report Status PENDING   Incomplete  URINE CULTURE     Status: None   Collection Time    11/03/12  1:56 AM      Result Value Range Status   Specimen Description URINE, CATHETERIZED   Final   Special Requests NONE   Final   Culture  Setup Time     Final   Value: 11/03/2012 03:13     Performed at [facility]  Count     Final   Value: NO GROWTH     Performed at [facility]   Culture     Final   Value: NO GROWTH     Performed at [facility]   Report Status 11/04/2012 FINAL   Final  MRSA PCR SCREENING     Status: None   Collection Time    11/03/12  2:58 AM      Result Value Range Status   MRSA by PCR NEGATIVE  NEGATIVE Final   Comment:            The GeneXpert MRSA Assay (FDA     approved for NASAL specimens     only), is one component of a     comprehensive MRSA colonization     surveillance program. It is not     intended to diagnose MRSA     infection nor to guide or     monitor treatment for     MRSA infections.   Studies/Results: Dg Neck Soft Tissue  11/03/2012   CLINICAL DATA:  Abnormal chest radiograph.  EXAM: NECK SOFT TISSUES - 1+ VIEW  COMPARISON:  None.  FINDINGS: Nasogastric tube and ET tubes are identified.  There is no evidence of retropharyngeal soft tissue swelling or epiglottic enlargement. No subcutaneous emphysema identified.  IMPRESSION: 1.  NG tube and ET tubes in place.  2. No subcutaneous emphysema identified.   Electronically Signed   By: Signa Kell M.D.   On: 11/03/2012 11:31   Ct Head Wo Contrast  11/03/2012   CLINICAL DATA:  Altered mental status.  Collapsed and unresponsive.  EXAM: CT HEAD WITHOUT CONTRAST  TECHNIQUE: Contiguous axial images were obtained from the base of the skull through the vertex without intravenous contrast.  COMPARISON:  None.  FINDINGS: Skull:No acute osseous abnormality. No lytic or blastic lesion.  Orbits: No acute abnormality.  Sinuses: Scattered inflammatory mucosal thickening. Nasopharyngeal fluid related to intubation.  Brain: No definitive evidence of acute abnormality, such as acute gray matter infarction, hemorrhage, hydrocephalus, or mass lesion/mass effect. Relatively ill-defined low attenuation in the posterior limb left internal capsule.  IMPRESSION: 1. No evidence of acute abnormality, including hemorrhage or  large territory ischemia. 2. Nonspecific white matter low-attenuation in the left internal capsule, often related to chronic small vessel ischemia.   Electronically Signed   By: Tiburcio Pea M.D.   On: 11/03/2012 03:19   Dg Chest 1v Repeat Same Day  11/03/2012   CLINICAL DATA:  Respiratory failure  EXAM: CHEST - 1 VIEW SAME DAY  COMPARISON:  11/03/2012  FINDINGS: Endotracheal tube tip is above the carina. There is an enteric tube in place. The heart size appears normal. No pleural effusion or edema. Lung volumes appear low. No airspace consolidation.  IMPRESSION: 1. Low lung volumes.  2. Endotracheal tube tip is in satisfactory position above the carina   Electronically Signed   By: Signa Kell M.D.   On: 11/03/2012 09:24   Dg Chest Portable 1 View  11/03/2012   *RADIOLOGY REPORT*  Clinical Data: Bronchitis, neck surgery.  PORTABLE CHEST - 1 VIEW  Comparison: Chest radiograph June 23, 2012.  Findings: Cardiomediastinal silhouette is unremarkable for this low inspiratory portable examination with crowded vasculature markings. The lungs are clear without pleural effusions or focal consolidations.  Trachea projects midline and there is no pneumothorax.  Endotracheal tube tip projects 2.2 cm above the carina. Multiple EKG lines overlay the patient and could obscure underlying subtle pathology.  Suspected subcutaneous gas in the right neck.  IMPRESSION: No acute cardiopulmonary process.  Endotracheal tube tip projects 2.3 cm above the carina.  Suspected right neck subcutaneous gas, recommend correlation with date of reported neck surgery.   Original Report Authenticated By: Awilda Metro    Medications: I have reviewed the patient's current medications.  Assesment: Principal Problem:   Acute respiratory failure Active Problems:   COPD with acute exacerbation   Schizophrenia   Hypotension   NSTEMI (non-ST elevated myocardial infarction)    Plan: Continue ventilatory support Continue IV  steroids and Iv antibiotics As per pulmonary recommendation Will monitor CB/BMP and ABG     LOS: 1 day   Marykay Mccleod 11/04/2012, 8:13 AM

## 2012-11-04 NOTE — Procedures (Signed)
Extubation Procedure Note  Patient Details:   Name: Terry [name] DOB: 17-Jun-1955 MRN: [id number]   Airway Documentation:   RT performed weaning parameters and ABG on patient. Patient performed -40 NIF anf FVC 1.2L, ABG results also within normal range. Results were called to physician and RT received orders to extubate to. RT placed patient on 4L O2 for SATs of 94%, HR 116 , RR 16 and BBS diminished. No apparent complications noted at this time  Evaluation  O2 sats: stable throughout Complications: Complications of increase in HR 130-135;however, has decreased to 116 Patient did tolerate procedure well. Bilateral Breath Sounds: Diminished Suctioning: Oral;Airway Yes  Cloretta Ned 11/04/2012, 12:51 PM

## 2012-11-04 NOTE — Progress Notes (Signed)
Subjective: He remains intubated and sedated. No new problems have been noted.  Objective: Vital signs in last 24 hours: Temp:  [98.2 F (36.8 C)-99.9 F (37.7 C)] 98.3 F (36.8 C) (10/14 0400) Resp:  [14-22] 16 (10/14 0700) BP: (90-126)/(40-112) 105/50 mmHg (10/14 0700) SpO2:  [95 %-98 %] 97 % (10/14 0704) FiO2 (%):  [50 %] 50 % (10/14 0704) Weight change:  Last BM Date:  (unsure)  Intake/Output from previous day: 10/13 0701 - 10/14 0700 In: 2957 [I.V.:2757; IV Piggyback:200] Out: 4725 [Urine:4725]  PHYSICAL EXAM General appearance: He is intubated on a ventilator and sedated. He is morbidly obese. Resp: rhonchi bilaterally Cardio: regular rate and rhythm, S1, S2 normal, no murmur, click, rub or gallop GI: soft, non-tender; bowel sounds normal; no masses,  no organomegaly Extremities: extremities normal, atraumatic, no cyanosis or edema  Lab Results:    Basic Metabolic Panel:  Recent Labs  78/29/56 0435 11/04/12 0525  NA 141 139  K 4.0 3.9  CL 107 107  CO2 23 22  GLUCOSE 152* 170*  BUN 17 13  CREATININE 1.34 1.11  CALCIUM 8.8 9.1   Liver Function Tests:  Recent Labs  11/03/12 0010 11/03/12 0435  AST 30 46*  ALT 25 38  ALKPHOS 128* 118*  BILITOT 0.4 0.6  PROT 7.6 7.3  ALBUMIN 3.6 3.5   No results found for this basename: LIPASE, AMYLASE,  in the last 72 hours No results found for this basename: AMMONIA,  in the last 72 hours CBC:  Recent Labs  11/03/12 0435 11/04/12 0525  WBC 11.9* 14.3*  HGB 14.5 14.3  HCT 44.7 43.1  MCV 84.3 83.0  PLT 172 148*   Cardiac Enzymes:  Recent Labs  11/03/12 0836 11/03/12 1557 11/03/12 2017  TROPONINI 0.54* 0.33* <0.30   BNP:  Recent Labs  11/03/12 0010  PROBNP 71.0   D-Dimer: No results found for this basename: DDIMER,  in the last 72 hours CBG:  Recent Labs  11/03/12 1142 11/03/12 1641 11/03/12 2044 11/03/12 2342 11/04/12 0354 11/04/12 0749  GLUCAP 134* 121* 138* 122* 149* 169*    Hemoglobin A1C: No results found for this basename: HGBA1C,  in the last 72 hours Fasting Lipid Panel: No results found for this basename: CHOL, HDL, LDLCALC, TRIG, CHOLHDL, LDLDIRECT,  in the last 72 hours Thyroid Function Tests: No results found for this basename: TSH, T4TOTAL, FREET4, T3FREE, THYROIDAB,  in the last 72 hours Anemia Panel: No results found for this basename: VITAMINB12, FOLATE, FERRITIN, TIBC, IRON, RETICCTPCT,  in the last 72 hours Coagulation: No results found for this basename: LABPROT, INR,  in the last 72 hours Urine Drug Screen: Drugs of Abuse     Component Value Date/Time   LABOPIA NONE DETECTED 06/23/2012 0531   COCAINSCRNUR NONE DETECTED 06/23/2012 0531   LABBENZ NONE DETECTED 06/23/2012 0531   AMPHETMU NONE DETECTED 06/23/2012 0531   THCU NONE DETECTED 06/23/2012 0531   LABBARB NONE DETECTED 06/23/2012 0531    Alcohol Level: No results found for this basename: ETH,  in the last 72 hours Urinalysis:  Recent Labs  11/03/12 0156  COLORURINE YELLOW  LABSPEC >1.030*  PHURINE 5.5  GLUCOSEU 250*  HGBUR LARGE*  BILIRUBINUR NEGATIVE  KETONESUR NEGATIVE  PROTEINUR 30*  UROBILINOGEN 0.2  NITRITE NEGATIVE  LEUKOCYTESUR NEGATIVE   Misc. Labs:  ABGS  Recent Labs  11/04/12 0515  PHART 7.455*  PO2ART 184.0*  TCO2 18.8  HCO3 21.5   CULTURES Recent Results (from the past 240  hour(s))  CULTURE, BLOOD (ROUTINE X 2)     Status: None   Collection Time    11/03/12 12:48 AM      Result Value Range Status   Specimen Description Blood BLOOD LEFT HAND   Final   Special Requests BOTTLES DRAWN AEROBIC AND ANAEROBIC 5CC   Final   Culture NO GROWTH <24 HRS   Final   Report Status PENDING   Incomplete  CULTURE, BLOOD (ROUTINE X 2)     Status: None   Collection Time    11/03/12 12:55 AM      Result Value Range Status   Specimen Description Blood LEFT ANTECUBITAL   Final   Special Requests BOTTLES DRAWN AEROBIC AND ANAEROBIC 6CC   Final   Culture NO GROWTH <24  HRS   Final   Report Status PENDING   Incomplete  URINE CULTURE     Status: None   Collection Time    11/03/12  1:56 AM      Result Value Range Status   Specimen Description URINE, CATHETERIZED   Final   Special Requests NONE   Final   Culture  Setup Time     Final   Value: 11/03/2012 03:13     Performed at [facility] Count     Final   Value: NO GROWTH     Performed at [facility]   Culture     Final   Value: NO GROWTH     Performed at [facility]   Report Status 11/04/2012 FINAL   Final  MRSA PCR SCREENING     Status: None   Collection Time    11/03/12  2:58 AM      Result Value Range Status   MRSA by PCR NEGATIVE  NEGATIVE Final   Comment:            The GeneXpert MRSA Assay (FDA     approved for NASAL specimens     only), is one component of a     comprehensive MRSA colonization     surveillance program. It is not     intended to diagnose MRSA     infection nor to guide or     monitor treatment for     MRSA infections.   Studies/Results: Dg Neck Soft Tissue  11/03/2012   CLINICAL DATA:  Abnormal chest radiograph.  EXAM: NECK SOFT TISSUES - 1+ VIEW  COMPARISON:  None.  FINDINGS: Nasogastric tube and ET tubes are identified. There is no evidence of retropharyngeal soft tissue swelling or epiglottic enlargement. No subcutaneous emphysema identified.  IMPRESSION: 1.  NG tube and ET tubes in place.  2. No subcutaneous emphysema identified.   Electronically Signed   By: Signa Kell M.D.   On: 11/03/2012 11:31   Ct Head Wo Contrast  11/03/2012   CLINICAL DATA:  Altered mental status.  Collapsed and unresponsive.  EXAM: CT HEAD WITHOUT CONTRAST  TECHNIQUE: Contiguous axial images were obtained from the base of the skull through the vertex without intravenous contrast.  COMPARISON:  None.  FINDINGS: Skull:No acute osseous abnormality. No lytic or blastic lesion.  Orbits: No acute abnormality.  Sinuses: Scattered inflammatory mucosal  thickening. Nasopharyngeal fluid related to intubation.  Brain: No definitive evidence of acute abnormality, such as acute gray matter infarction, hemorrhage, hydrocephalus, or mass lesion/mass effect. Relatively ill-defined low attenuation in the posterior limb left internal capsule.  IMPRESSION: 1. No evidence of acute abnormality, including hemorrhage or  large territory ischemia. 2. Nonspecific white matter low-attenuation in the left internal capsule, often related to chronic small vessel ischemia.   Electronically Signed   By: Tiburcio Pea M.D.   On: 11/03/2012 03:19   Dg Chest 1v Repeat Same Day  11/03/2012   CLINICAL DATA:  Respiratory failure  EXAM: CHEST - 1 VIEW SAME DAY  COMPARISON:  11/03/2012  FINDINGS: Endotracheal tube tip is above the carina. There is an enteric tube in place. The heart size appears normal. No pleural effusion or edema. Lung volumes appear low. No airspace consolidation.  IMPRESSION: 1. Low lung volumes.  2. Endotracheal tube tip is in satisfactory position above the carina   Electronically Signed   By: Signa Kell M.D.   On: 11/03/2012 09:24   Dg Chest Portable 1 View  11/03/2012   *RADIOLOGY REPORT*  Clinical Data: Bronchitis, neck surgery.  PORTABLE CHEST - 1 VIEW  Comparison: Chest radiograph June 23, 2012.  Findings: Cardiomediastinal silhouette is unremarkable for this low inspiratory portable examination with crowded vasculature markings. The lungs are clear without pleural effusions or focal consolidations.  Trachea projects midline and there is no pneumothorax.  Endotracheal tube tip projects 2.2 cm above the carina. Multiple EKG lines overlay the patient and could obscure underlying subtle pathology.  Suspected subcutaneous gas in the right neck.  IMPRESSION: No acute cardiopulmonary process.  Endotracheal tube tip projects 2.3 cm above the carina.  Suspected right neck subcutaneous gas, recommend correlation with date of reported neck surgery.   Original  Report Authenticated By: Awilda Metro    Medications:  Scheduled: . albuterol  2.5 mg Nebulization Q4H  . antiseptic oral rinse  15 mL Mouth Rinse QID  . aspirin  81 mg Per NG tube Daily  . chlorhexidine  15 mL Mouth Rinse BID  . enoxaparin (LOVENOX) injection  40 mg Subcutaneous Q24H  . famotidine (PEPCID) IV  20 mg Intravenous Q12H  . influenza vac split quadrivalent PF  0.5 mL Intramuscular Tomorrow-1000  . ipratropium  0.5 mg Nebulization Q4H  . levofloxacin (LEVAQUIN) IV  500 mg Intravenous Q24H  . methylPREDNISolone (SOLU-MEDROL) injection  80 mg Intravenous Q6H  . pneumococcal 23 valent vaccine  0.5 mL Intramuscular Tomorrow-1000  . sodium chloride  3 mL Intravenous Q12H   Continuous: . sodium chloride 100 mL/hr at 11/04/12 0253  . fentaNYL infusion INTRAVENOUS 200 mcg/hr (11/04/12 4098)  . propofol 40 mcg/kg/min (11/04/12 0527)   JXB:[id number], acetaminophen, albuterol, fentaNYL, ondansetron (ZOFRAN) IV, ondansetron  Assesment: He has acute respiratory failure. He has COPD with acute exacerbation. He has had a non-STEMI. He seems to have improved somewhat. Principal Problem:   Acute respiratory failure Active Problems:   COPD with acute exacerbation   Schizophrenia   Hypotension   NSTEMI (non-ST elevated myocardial infarction)    Plan: He will be continued with antibiotics and steroids and will see if he can undergo any weaning today    LOS: 1 day   Xavier Munger L 11/04/2012, 8:20 AM

## 2012-11-04 NOTE — Care Management Note (Signed)
    Page 1 of 1   11/07/2012     11:11:38 AM   CARE MANAGEMENT NOTE 11/07/2012  Patient:  Terry [name], Terry [name]   Account Number:  [id number]  Date Initiated:  11/04/2012  Documentation initiated by:  Sharrie Rothman  Subjective/Objective Assessment:   Pt admitted from home with respiratory failure. Pt lives with his mom and will return home at discharge. Pt states that he is fairly independent with ADL's. Pt has neb machine for home use.     Action/Plan:   Will continue to follow for [facility] needs.   Anticipated DC Date:  11/10/2012   Anticipated DC Plan:  HOME W HOME HEALTH SERVICES      DC Planning Services  CM consult      Choice offered to / List presented to:             Status of service:  Completed, signed off Medicare Important Message given?  YES (If response is "NO", the following Medicare IM given date fields will be blank) Date Medicare IM given:  11/07/2012 Date Additional Medicare IM given:    Discharge Disposition:  HOME/SELF CARE  Per UR Regulation:    If discussed at Long Length of Stay Meetings, dates discussed:    Comments:  11/04/12 1415 Arlyss Queen, RN BSN CM

## 2012-11-04 NOTE — Progress Notes (Signed)
Consulting cardiologist: Nona Dell MD  Subjective:   Intubated and sedated.   Objective:   Temp:  [98.1 F (36.7 C)-99.9 F (37.7 C)] 98.3 F (36.8 C) (10/14 0400) Pulse Rate:  [79] 79 (10/13 0800) Resp:  [13-22] 16 (10/14 0700) BP: (90-126)/(40-112) 105/50 mmHg (10/14 0700) SpO2:  [95 %-98 %] 97 % (10/14 0704) FiO2 (%):  [50 %] 50 % (10/14 0704) Last BM Date:  (unsure)  Filed Weights   11/03/12 0320  Weight: 264 lb 1.8 oz (119.8 kg)    Intake/Output Summary (Last 24 hours) at 11/04/12 0750 Last data filed at 11/04/12 0600  Gross per 24 hour  Intake 2957.04 ml  Output   4725 ml  Net -1767.96 ml    Telemetry: Sinus bradycardia 60's.   Exam:  General: Sedated on the ventilator.  Lungs: Clear to auscultation, nonlabored.   Cardiac: RRR, no gallop or rub.   Abdomen: Normoactive bowel sounds, nontender, nondistended.  Extremities: No pitting edema.  Lab Results:  Basic Metabolic Panel:  Recent Labs Lab 11/03/12 0010 11/03/12 0105 11/03/12 0435 11/04/12 0525  NA 136 141 141 139  K 3.7 3.9 4.0 3.9  CL 98 106 107 107  CO2 23  --  23 22  GLUCOSE 224* 217* 152* 170*  BUN 18 23 17 13   CREATININE 1.55* 1.40* 1.34 1.11  CALCIUM 9.1  --  8.8 9.1    Liver Function Tests:  Recent Labs Lab 11/03/12 0010 11/03/12 0435  AST 30 46*  ALT 25 38  ALKPHOS 128* 118*  BILITOT 0.4 0.6  PROT 7.6 7.3  ALBUMIN 3.6 3.5    CBC:  Recent Labs Lab 11/03/12 0010 11/03/12 0105 11/03/12 0435 11/04/12 0525  WBC 15.3*  --  11.9* 14.3*  HGB 15.0 17.0 14.5 14.3  HCT 46.8 50.0 44.7 43.1  MCV 86.8  --  84.3 83.0  PLT 205  --  172 148*    Cardiac Enzymes:  Recent Labs Lab 11/03/12 0836 11/03/12 1557 11/03/12 2017  TROPONINI 0.54* 0.33* <0.30    BNP:  Recent Labs  11/03/12 0010  PROBNP 71.0    Echocardiogram:  Study Conclusions  - Left ventricle: The cavity size was normal. There was mild concentric hypertrophy. Systolic function was  normal. The estimated ejection fraction was in the range of 60% to 65%. Wall motion was normal; there were no regional wall motion abnormalities. Doppler parameters are consistent with abnormal left ventricular relaxation (grade 1 diastolic dysfunction). - Mitral valve: Mildly thickened leaflets . No significant regurgitation. - Tricuspid valve: Trivial regurgitation. - Inferior vena cava: Not visualized. Unable to estimate CVP. - Pericardium, extracardiac: There was no pericardial effusion. Impressions:  - No prior study for comparison. Mild LVH with LVEF 60-65%, no wall motion abnormalities, grade 1 diastolic dysfunction. Trivial tricuspid regurgitation. Unable to assess PASP or CVP.   Medications:   Scheduled Medications: . albuterol  2.5 mg Nebulization Q4H  . antiseptic oral rinse  15 mL Mouth Rinse QID  . aspirin  81 mg Per NG tube Daily  . chlorhexidine  15 mL Mouth Rinse BID  . enoxaparin (LOVENOX) injection  40 mg Subcutaneous Q24H  . famotidine (PEPCID) IV  20 mg Intravenous Q12H  . influenza vac split quadrivalent PF  0.5 mL Intramuscular Tomorrow-1000  . ipratropium  0.5 mg Nebulization Q4H  . levofloxacin (LEVAQUIN) IV  500 mg Intravenous Q24H  . methylPREDNISolone (SOLU-MEDROL) injection  80 mg Intravenous Q6H  . pneumococcal 23 valent vaccine  0.5 mL Intramuscular Tomorrow-1000  . sodium chloride  3 mL Intravenous Q12H     Infusions: . sodium chloride 100 mL/hr at 11/04/12 0253  . fentaNYL infusion INTRAVENOUS 200 mcg/hr (11/04/12 1478)  . propofol 40 mcg/kg/min (11/04/12 0527)     PRN Medications:  acetaminophen, acetaminophen, albuterol, fentaNYL, ondansetron (ZOFRAN) IV, ondansetron   Assessment and Plan:   1. NSTEMI, type II: Most likely related to physiologic stress with acute respiratory failure and COPD. Echocardiogram shows preserved LVEF without obvious wall motion abnormalities, grade 1 diastolic dysfunction. Troponin I peaked at only 0.54,  now normal. ECG shows fairly diffuse ST-T wave abnormalities and prolonged QT.  2. COPD: Patient is ventilator dependent at this time and being followed by Dr. Juanetta Gosling pulmonary or ongoing management. The patient is on antibiotics and steroids.  Bettey Mare. Lyman Bishop NP [facility] 11/04/2012, 7:50 AM   Attending note:  Patient seen and examined. Modified above note by Ms. Lawrence NP. Patient remains ventilator dependent, being managed by Dr. Juanetta Gosling. He is otherwise hemodynamically stable and remains in sinus rhythm. Followup echocardiogram demonstrates preserved LVEF, no definite wall motion abnormalities, grade 1 diastolic dysfunction. Troponin I levels peaked at only 0.54, and have now normalized. ECG shows fairly diffuse ST-T wave abnormalities. At this point, doubt major ischemic event as cause of acute respiratory failure. Would continue supportive management. Also on aspirin.  Jonelle Sidle, M.D., F.A.C.C.

## 2012-11-04 NOTE — Progress Notes (Signed)
Patient off of the vent.  Diprivan wasted in Pxysis- 200mg /40ml wasted and witnessed by Thurnell Lose, RN.  Also Fentanyl 2540mcg/250ml wasted in the sink and witnessed by Thurnell Lose, RN.

## 2012-11-05 LAB — GLUCOSE, CAPILLARY

## 2012-11-05 MED ORDER — LEVOFLOXACIN 500 MG PO TABS
500.0000 mg | ORAL_TABLET | Freq: Every day | ORAL | Status: DC
  Administered 2012-11-05 – 2012-11-06 (×2): 500 mg via ORAL
  Filled 2012-11-05 (×2): qty 1

## 2012-11-05 MED ORDER — TIOTROPIUM BROMIDE MONOHYDRATE 18 MCG IN CAPS
18.0000 ug | ORAL_CAPSULE | Freq: Every day | RESPIRATORY_TRACT | Status: DC
  Administered 2012-11-05 – 2012-11-07 (×3): 18 ug via RESPIRATORY_TRACT
  Filled 2012-11-05: qty 5

## 2012-11-05 MED ORDER — BUDESONIDE-FORMOTEROL FUMARATE 160-4.5 MCG/ACT IN AERO
2.0000 | INHALATION_SPRAY | Freq: Two times a day (BID) | RESPIRATORY_TRACT | Status: DC
  Administered 2012-11-05 – 2012-11-07 (×5): 2 via RESPIRATORY_TRACT
  Filled 2012-11-05: qty 6

## 2012-11-05 MED ORDER — FAMOTIDINE 20 MG PO TABS
20.0000 mg | ORAL_TABLET | Freq: Two times a day (BID) | ORAL | Status: DC
  Administered 2012-11-05: 20 mg via ORAL
  Filled 2012-11-05: qty 1

## 2012-11-05 NOTE — Progress Notes (Signed)
Consulting cardiologist: Dr. Jonelle Sidle  Subjective:   Awake and alert today. Does not report any recent chest pain or palpitations. Breathing comfortably.   Objective:   Temp:  [98 F (36.7 C)-98.8 F (37.1 C)] 98.2 F (36.8 C) (10/15 0730) Pulse Rate:  [93] 93 (10/15 0800) Resp:  [11-22] 18 (10/15 0900) BP: (81-152)/(38-79) 81/38 mmHg (10/15 0900) SpO2:  [97 %-100 %] 97 % (10/15 0800) Weight:  [267 lb 6.7 oz (121.3 kg)] 267 lb 6.7 oz (121.3 kg) (10/15 0500) Last BM Date:  (unsure)  Filed Weights   11/03/12 0320 11/05/12 0500  Weight: 264 lb 1.8 oz (119.8 kg) 267 lb 6.7 oz (121.3 kg)    Intake/Output Summary (Last 24 hours) at 11/05/12 0937 Last data filed at 11/05/12 0500  Gross per 24 hour  Intake    480 ml  Output   1150 ml  Net   -670 ml    Telemetry: Sinus rhythm.  Exam:  General: Comfortable, in no distress.  Lungs: Diminished breath sounds but no wheezing.  Cardiac: RRR, no gallop.  Extremities: No pitting edema.  Lab Results:  Basic Metabolic Panel:  Recent Labs Lab 11/03/12 0010 11/03/12 0105 11/03/12 0435 11/04/12 0525  NA 136 141 141 139  K 3.7 3.9 4.0 3.9  CL 98 106 107 107  CO2 23  --  23 22  GLUCOSE 224* 217* 152* 170*  BUN 18 23 17 13   CREATININE 1.55* 1.40* 1.34 1.11  CALCIUM 9.1  --  8.8 9.1    Liver Function Tests:  Recent Labs Lab 11/03/12 0010 11/03/12 0435  AST 30 46*  ALT 25 38  ALKPHOS 128* 118*  BILITOT 0.4 0.6  PROT 7.6 7.3  ALBUMIN 3.6 3.5    CBC:  Recent Labs Lab 11/03/12 0010 11/03/12 0105 11/03/12 0435 11/04/12 0525  WBC 15.3*  --  11.9* 14.3*  HGB 15.0 17.0 14.5 14.3  HCT 46.8 50.0 44.7 43.1  MCV 86.8  --  84.3 83.0  PLT 205  --  172 148*    Cardiac Enzymes:  Recent Labs Lab 11/03/12 0836 11/03/12 1557 11/03/12 2017  TROPONINI 0.54* 0.33* <0.30    ECG: Sinus rhythm with anterolateral T wave inversions, prolonged QT, LVH.   Medications:   Scheduled Medications: .  albuterol  2.5 mg Nebulization Q4H  . amLODipine  5 mg Oral Daily  . aspirin  81 mg Per NG tube Daily  . benztropine  1 mg Oral QHS  . budesonide-formoterol  2 puff Inhalation BID  . enoxaparin (LOVENOX) injection  40 mg Subcutaneous Q24H  . famotidine (PEPCID) IV  20 mg Intravenous Q12H  . levofloxacin (LEVAQUIN) IV  500 mg Intravenous Q24H  . methylPREDNISolone (SOLU-MEDROL) injection  80 mg Intravenous Q6H  . sodium chloride  3 mL Intravenous Q12H  . tiotropium  18 mcg Inhalation Daily  . ziprasidone  80 mg Oral QHS     Infusions: . sodium chloride 20 mL/hr at 11/05/12 0900     PRN Medications:  acetaminophen, acetaminophen, albuterol, ondansetron (ZOFRAN) IV, ondansetron   Assessment:   1. NSTEMI, type II: Most likely related to physiologic stress with acute respiratory failure and COPD. Echocardiogram shows preserved LVEF without obvious wall motion abnormalities, grade 1 diastolic dysfunction. Troponin I peaked at only 0.54, now normal. He is stable following extubation, does not recall any recent chest pain symptoms.  2. COPD: Status post successful extubation, breathing comfortably at this time. Dr. Juanetta Gosling is managing pulmonary  status with medication adjustments.   Plan/Discussion:    Continue aspirin. No additional cardiac workup planned at this point. Would focus on stabilizing pulmonary medical regimen, determining if he needs any supplemental oxygen. Gradually increase activity.   Jonelle Sidle, M.D., F.A.C.C.
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
## 2012-11-05 NOTE — Progress Notes (Signed)
Subjective: He is much improved. He was able to be extubated yesterday and has done well post extubation. He is awake and alert. I discussed the fact that he is probably going to need further and more intensive treatment for his COPD and he says he feels like he needs oxygen at home.  Objective: Vital signs in last 24 hours: Temp:  [98 F (36.7 C)-98.8 F (37.1 C)] 98.1 F (36.7 C) (10/15 0400) Pulse Rate:  [87] 87 (10/14 0800) Resp:  [11-22] 17 (10/15 0500) BP: (81-152)/(38-79) 91/42 mmHg (10/15 0500) SpO2:  [97 %-100 %] 97 % (10/15 0332) FiO2 (%):  [40 %-50 %] 40 % (10/14 0857) Weight:  [121.3 kg (267 lb 6.7 oz)] 121.3 kg (267 lb 6.7 oz) (10/15 0500) Weight change:  Last BM Date:  (unsure)  Intake/Output from previous day: 10/14 0701 - 10/15 0700 In: 540 [P.O.:240; I.V.:100; IV Piggyback:200] Out: 1150 [Urine:1150]  PHYSICAL EXAM General appearance: alert, cooperative, no distress and morbidly obese Resp: clear to auscultation bilaterally Cardio: regular rate and rhythm, S1, S2 normal, no murmur, click, rub or gallop GI: soft, non-tender; bowel sounds normal; no masses,  no organomegaly Extremities: extremities normal, atraumatic, no cyanosis or edema  Lab Results:    Basic Metabolic Panel:  Recent Labs  16/10/96 0435 11/04/12 0525  NA 141 139  K 4.0 3.9  CL 107 107  CO2 23 22  GLUCOSE 152* 170*  BUN 17 13  CREATININE 1.34 1.11  CALCIUM 8.8 9.1   Liver Function Tests:  Recent Labs  11/03/12 0010 11/03/12 0435  AST 30 46*  ALT 25 38  ALKPHOS 128* 118*  BILITOT 0.4 0.6  PROT 7.6 7.3  ALBUMIN 3.6 3.5   No results found for this basename: LIPASE, AMYLASE,  in the last 72 hours No results found for this basename: AMMONIA,  in the last 72 hours CBC:  Recent Labs  11/03/12 0435 11/04/12 0525  WBC 11.9* 14.3*  HGB 14.5 14.3  HCT 44.7 43.1  MCV 84.3 83.0  PLT 172 148*   Cardiac Enzymes:  Recent Labs  11/03/12 0836 11/03/12 1557 11/03/12 2017   TROPONINI 0.54* 0.33* <0.30   BNP:  Recent Labs  11/03/12 0010  PROBNP 71.0   D-Dimer: No results found for this basename: DDIMER,  in the last 72 hours CBG:  Recent Labs  11/03/12 2044 11/03/12 2342 11/04/12 0354 11/04/12 0749 11/04/12 1141 11/04/12 1650  GLUCAP 138* 122* 149* 169* 154* 137*   Hemoglobin A1C: No results found for this basename: HGBA1C,  in the last 72 hours Fasting Lipid Panel: No results found for this basename: CHOL, HDL, LDLCALC, TRIG, CHOLHDL, LDLDIRECT,  in the last 72 hours Thyroid Function Tests: No results found for this basename: TSH, T4TOTAL, FREET4, T3FREE, THYROIDAB,  in the last 72 hours Anemia Panel: No results found for this basename: VITAMINB12, FOLATE, FERRITIN, TIBC, IRON, RETICCTPCT,  in the last 72 hours Coagulation: No results found for this basename: LABPROT, INR,  in the last 72 hours Urine Drug Screen: Drugs of Abuse     Component Value Date/Time   LABOPIA NONE DETECTED 06/23/2012 0531   COCAINSCRNUR NONE DETECTED 06/23/2012 0531   LABBENZ NONE DETECTED 06/23/2012 0531   AMPHETMU NONE DETECTED 06/23/2012 0531   THCU NONE DETECTED 06/23/2012 0531   LABBARB NONE DETECTED 06/23/2012 0531    Alcohol Level: No results found for this basename: ETH,  in the last 72 hours Urinalysis:  Recent Labs  11/03/12 0156  COLORURINE YELLOW  LABSPEC >1.030*  PHURINE 5.5  GLUCOSEU 250*  HGBUR LARGE*  BILIRUBINUR NEGATIVE  KETONESUR NEGATIVE  PROTEINUR 30*  UROBILINOGEN 0.2  NITRITE NEGATIVE  LEUKOCYTESUR NEGATIVE   Misc. Labs:  ABGS  Recent Labs  11/04/12 1210  PHART 7.394  PO2ART 161.0*  TCO2 18.9  HCO3 21.7   CULTURES Recent Results (from the past 240 hour(s))  CULTURE, BLOOD (ROUTINE X 2)     Status: None   Collection Time    11/03/12 12:48 AM      Result Value Range Status   Specimen Description BLOOD LEFT HAND   Final   Special Requests BOTTLES DRAWN AEROBIC AND ANAEROBIC 5CC   Final   Culture NO GROWTH 1 DAY   Final    Report Status PENDING   Incomplete  CULTURE, BLOOD (ROUTINE X 2)     Status: None   Collection Time    11/03/12 12:55 AM      Result Value Range Status   Specimen Description BLOOD LEFT ANTECUBITAL   Final   Special Requests BOTTLES DRAWN AEROBIC AND ANAEROBIC 6CC   Final   Culture NO GROWTH 1 DAY   Final   Report Status PENDING   Incomplete  URINE CULTURE     Status: None   Collection Time    11/03/12  1:56 AM      Result Value Range Status   Specimen Description URINE, CATHETERIZED   Final   Special Requests NONE   Final   Culture  Setup Time     Final   Value: 11/03/2012 03:13     Performed at [facility] Count     Final   Value: NO GROWTH     Performed at [facility]   Culture     Final   Value: NO GROWTH     Performed at [facility]   Report Status 11/04/2012 FINAL   Final  MRSA PCR SCREENING     Status: None   Collection Time    11/03/12  2:58 AM      Result Value Range Status   MRSA by PCR NEGATIVE  NEGATIVE Final   Comment:            The GeneXpert MRSA Assay (FDA     approved for NASAL specimens     only), is one component of a     comprehensive MRSA colonization     surveillance program. It is not     intended to diagnose MRSA     infection nor to guide or     monitor treatment for     MRSA infections.   Studies/Results: Dg Neck Soft Tissue  11/03/2012   CLINICAL DATA:  Abnormal chest radiograph.  EXAM: NECK SOFT TISSUES - 1+ VIEW  COMPARISON:  None.  FINDINGS: Nasogastric tube and ET tubes are identified. There is no evidence of retropharyngeal soft tissue swelling or epiglottic enlargement. No subcutaneous emphysema identified.  IMPRESSION: 1.  NG tube and ET tubes in place.  2. No subcutaneous emphysema identified.   Electronically Signed   By: Signa Kell M.D.   On: 11/03/2012 11:31   Dg Chest 1v Repeat Same Day  11/03/2012   CLINICAL DATA:  Respiratory failure  EXAM: CHEST - 1 VIEW SAME DAY  COMPARISON:   11/03/2012  FINDINGS: Endotracheal tube tip is above the carina. There is an enteric tube in place. The heart size appears normal. No pleural effusion or edema. Lung volumes  appear low. No airspace consolidation.  IMPRESSION: 1. Low lung volumes.  2. Endotracheal tube tip is in satisfactory position above the carina   Electronically Signed   By: Signa Kell M.D.   On: 11/03/2012 09:24   Dg Chest Port 1 View  11/04/2012   CLINICAL DATA:  Respiratory failure, history COPD, former smoker  EXAM: PORTABLE CHEST - 1 VIEW  COMPARISON:  Portable exam 0759 hr compared to 11/03/2012  FINDINGS: Tip of endotracheal tube projects 2.3 cm above carinal.  Nasogastric tube extends into stomach.  Upper normal heart size.  Nor mediastinal contours and pulmonary vascularity.  Low lung volumes with bibasilar atelectasis.  Questionable left upper lobe infiltrate.  Remaining lungs clear.  No gross pleural effusion or pneumothorax.  IMPRESSION: Bibasilar atelectasis.  Question subtle left upper lobe infiltrate versus superimposed artifacts.   Electronically Signed   By: Ulyses Southward M.D.   On: 11/04/2012 08:25    Medications:  Prior to Admission:  Prescriptions prior to admission  Medication Sig Dispense Refill  . albuterol (PROVENTIL) (2.5 MG/3ML) 0.083% nebulizer solution Take 2.5 mg by nebulization every 4 (four) hours as needed for wheezing or shortness of breath.      Marland Kitchen albuterol (PROVENTIL) (2.5 MG/3ML) 0.083% nebulizer solution Take 3 mLs (2.5 mg total) by nebulization every 6 (six) hours as needed for wheezing.  75 mL  12  . amLODipine (NORVASC) 5 MG tablet Take 5 mg by mouth daily.      . benztropine (COGENTIN) 1 MG tablet Take 1 mg by mouth at bedtime.      . ziprasidone (GEODON) 80 MG capsule Take 80 mg by mouth at bedtime.       Scheduled: . albuterol  2.5 mg Nebulization Q4H  . amLODipine  5 mg Oral Daily  . aspirin  81 mg Per NG tube Daily  . benztropine  1 mg Oral QHS  . chlorhexidine  15 mL Mouth  Rinse BID  . enoxaparin (LOVENOX) injection  40 mg Subcutaneous Q24H  . famotidine (PEPCID) IV  20 mg Intravenous Q12H  . ipratropium  0.5 mg Nebulization Q4H  . levofloxacin (LEVAQUIN) IV  500 mg Intravenous Q24H  . methylPREDNISolone (SOLU-MEDROL) injection  80 mg Intravenous Q6H  . sodium chloride  3 mL Intravenous Q12H  . ziprasidone  80 mg Oral QHS   Continuous: . sodium chloride 100 mL/hr at 11/04/12 0253  . fentaNYL infusion INTRAVENOUS 10 mcg/hr (11/04/12 1100)  . propofol Stopped (11/04/12 1100)   OZH:[id number], acetaminophen, albuterol, fentaNYL, ondansetron (ZOFRAN) IV, ondansetron  Assesment: He was admitted with acute respiratory failure and has COPD with acute exacerbation. He had a non-STEMI as well. His treatment is complicated by his schizophrenia. Principal Problem:   Acute respiratory failure Active Problems:   COPD with acute exacerbation   Schizophrenia   Hypotension   NSTEMI (non-ST elevated myocardial infarction)    Plan: I would like to start him on Spiriva and Symbicort or similar medication and see if this will help with his COPD    LOS: 2 days   Kye Hedden L 11/05/2012, 7:35 AM

## 2012-11-05 NOTE — Progress Notes (Signed)
The patient is receiving Pepcid by the intravenous route.  Based on criteria approved by the Pharmacy and Therapeutics Committee and the Medical Executive Committee, the medication is being converted to the equivalent oral dose form.  These criteria include: -No Active GI bleeding -Able to tolerate diet of full liquids (or better) or tube feeding OR able to tolerate other medications by the oral or enteral route  This medication was added for stress ulcer px while patient intubated.  Consider d/c medication.  If you have any questions about this conversion, please contact the Pharmacy Department (ext [phone]).  Thank you.  Elson Clan, [facility] 11/05/2012 10:17 AM  PHARMACIST - PHYSICIAN COMMUNICATION DR:   Felecia Shelling CONCERNING: Antibiotic IV to Oral Route Change Policy  RECOMMENDATION: This patient is receiving Levaquin by the intravenous route.  Based on criteria approved by the Pharmacy and Therapeutics Committee, the antibiotic(s) is/are being converted to the equivalent oral dose form(s).   DESCRIPTION: These criteria include:  Patient being treated for a respiratory tract infection, urinary tract infection, cellulitis or clostridium difficile associated diarrhea if on metronidazole  The patient is not neutropenic and does not exhibit a GI malabsorption state  The patient is eating (either orally or via tube) and/or has been taking other orally administered medications for a least 24 hours  The patient is improving clinically and has a Tmax < 100.5  If you have questions about this conversion, please contact the Pharmacy Department  [x]   [phone] )  [facility]king []   [phone] )  [facility]  []   [phone] )  [facility] []   [phone] )  Ilene Qua   Junita Push, PharmD, BCPS

## 2012-11-05 NOTE — Progress Notes (Signed)
Report given to Deberah Castle, RN. Patient ready for transfer to room 340.  No acute distress noted.  Patient ambulating in room and without difficulty.  Up to department 300 by wheelchair accompanied by Edythe Clarity, NT.

## 2012-11-05 NOTE — Progress Notes (Signed)
Subjective: Patient was successfully extubated yesterday. He is doing much better. His breathing is improving.  Objective: Vital signs in last 24 hours: Temp:  [98 F (36.7 C)-98.8 F (37.1 C)] 98.1 F (36.7 C) (10/15 0400) Pulse Rate:  [87] 87 (10/14 0800) Resp:  [11-22] 17 (10/15 0500) BP: (81-152)/(38-79) 91/42 mmHg (10/15 0500) SpO2:  [97 %-100 %] 98 % (10/15 0749) FiO2 (%):  [40 %-50 %] 40 % (10/14 0857) Weight:  [121.3 kg (267 lb 6.7 oz)] 121.3 kg (267 lb 6.7 oz) (10/15 0500) Weight change:  Last BM Date:  (unsure)  Intake/Output from previous day: 10/14 0701 - 10/15 0700 In: 540 [P.O.:240; I.V.:100; IV Piggyback:200] Out: 1150 [Urine:1150]  PHYSICAL EXAM General appearance: slowed mentation Resp: diminished breath sounds bilaterally Cardio: S1, S2 normal GI: soft, non-tender; bowel sounds normal; no masses,  no organomegaly Extremities: extremities normal, atraumatic, no cyanosis or edema  Lab Results:    @labtest @ [facility]  Recent Labs  11/04/12 1210  PHART 7.394  PO2ART 161.0*  TCO2 18.9  HCO3 21.7   CULTURES Recent Results (from the past 240 hour(s))  CULTURE, BLOOD (ROUTINE X 2)     Status: None   Collection Time    11/03/12 12:48 AM      Result Value Range Status   Specimen Description BLOOD LEFT HAND   Final   Special Requests BOTTLES DRAWN AEROBIC AND ANAEROBIC 5CC   Final   Culture NO GROWTH 1 DAY   Final   Report Status PENDING   Incomplete  CULTURE, BLOOD (ROUTINE X 2)     Status: None   Collection Time    11/03/12 12:55 AM      Result Value Range Status   Specimen Description BLOOD LEFT ANTECUBITAL   Final   Special Requests BOTTLES DRAWN AEROBIC AND ANAEROBIC 6CC   Final   Culture NO GROWTH 1 DAY   Final   Report Status PENDING   Incomplete  URINE CULTURE     Status: None   Collection Time    11/03/12  1:56 AM      Result Value Range Status   Specimen Description URINE, CATHETERIZED   Final   Special Requests NONE   Final   Culture   Setup Time     Final   Value: 11/03/2012 03:13     Performed at [facility] Count     Final   Value: NO GROWTH     Performed at [facility]   Culture     Final   Value: NO GROWTH     Performed at [facility]   Report Status 11/04/2012 FINAL   Final  MRSA PCR SCREENING     Status: None   Collection Time    11/03/12  2:58 AM      Result Value Range Status   MRSA by PCR NEGATIVE  NEGATIVE Final   Comment:            The GeneXpert MRSA Assay (FDA     approved for NASAL specimens     only), is one component of a     comprehensive MRSA colonization     surveillance program. It is not     intended to diagnose MRSA     infection nor to guide or     monitor treatment for     MRSA infections.   Studies/Results: Dg Neck Soft Tissue  11/03/2012   CLINICAL DATA:  Abnormal chest radiograph.  EXAM: NECK SOFT TISSUES - 1+ VIEW  COMPARISON:  None.  FINDINGS: Nasogastric tube and ET tubes are identified. There is no evidence of retropharyngeal soft tissue swelling or epiglottic enlargement. No subcutaneous emphysema identified.  IMPRESSION: 1.  NG tube and ET tubes in place.  2. No subcutaneous emphysema identified.   Electronically Signed   By: Signa Kell M.D.   On: 11/03/2012 11:31   Dg Chest 1v Repeat Same Day  11/03/2012   CLINICAL DATA:  Respiratory failure  EXAM: CHEST - 1 VIEW SAME DAY  COMPARISON:  11/03/2012  FINDINGS: Endotracheal tube tip is above the carina. There is an enteric tube in place. The heart size appears normal. No pleural effusion or edema. Lung volumes appear low. No airspace consolidation.  IMPRESSION: 1. Low lung volumes.  2. Endotracheal tube tip is in satisfactory position above the carina   Electronically Signed   By: Signa Kell M.D.   On: 11/03/2012 09:24   Dg Chest Port 1 View  11/04/2012   CLINICAL DATA:  Respiratory failure, history COPD, former smoker  EXAM: PORTABLE CHEST - 1 VIEW  COMPARISON:  Portable exam 0759 hr  compared to 11/03/2012  FINDINGS: Tip of endotracheal tube projects 2.3 cm above carinal.  Nasogastric tube extends into stomach.  Upper normal heart size.  Nor mediastinal contours and pulmonary vascularity.  Low lung volumes with bibasilar atelectasis.  Questionable left upper lobe infiltrate.  Remaining lungs clear.  No gross pleural effusion or pneumothorax.  IMPRESSION: Bibasilar atelectasis.  Question subtle left upper lobe infiltrate versus superimposed artifacts.   Electronically Signed   By: Ulyses Southward M.D.   On: 11/04/2012 08:25    Medications: I have reviewed the patient's current medications.  Assesment: Principal Problem:   Acute respiratory failure Active Problems:   COPD with acute exacerbation   Schizophrenia   Hypotension   NSTEMI (non-ST elevated myocardial infarction) S/P extubation   Plan:t Continue IV steroids and Iv antibiotics Will D/C Iv fluid As per pulmonary recommendation Will monitor CB/BMP      LOS: 2 days   Allien Melberg 11/05/2012, 7:55 AM

## 2012-11-06 LAB — CBC
HCT: 45.3 % (ref 39.0–52.0)
Hemoglobin: 15 g/dL (ref 13.0–17.0)
MCH: 27.3 pg (ref 26.0–34.0)
MCV: 82.5 fL (ref 78.0–100.0)
RBC: 5.49 MIL/uL (ref 4.22–5.81)
WBC: 19.5 10*3/uL — ABNORMAL HIGH (ref 4.0–10.5)

## 2012-11-06 LAB — BASIC METABOLIC PANEL
CO2: 27 mEq/L (ref 19–32)
Calcium: 9.4 mg/dL (ref 8.4–10.5)
Chloride: 103 mEq/L (ref 96–112)
Creatinine, Ser: 0.94 mg/dL (ref 0.50–1.35)
GFR calc non Af Amer: >90 mL/min (ref >90)
Glucose, Bld: 158 mg/dL — ABNORMAL HIGH (ref 70–99)
Sodium: 140 mEq/L (ref 135–145)

## 2012-11-06 MED ORDER — DILTIAZEM HCL ER COATED BEADS 120 MG PO CP24
120.0000 mg | ORAL_CAPSULE | Freq: Every day | ORAL | Status: DC
  Administered 2012-11-06 – 2012-11-07 (×2): 120 mg via ORAL
  Filled 2012-11-06 (×2): qty 1

## 2012-11-06 MED ORDER — METHYLPREDNISOLONE SODIUM SUCC 40 MG IJ SOLR
40.0000 mg | Freq: Two times a day (BID) | INTRAMUSCULAR | Status: DC
  Administered 2012-11-06 – 2012-11-07 (×2): 40 mg via INTRAVENOUS
  Filled 2012-11-06 (×2): qty 1

## 2012-11-06 NOTE — Progress Notes (Signed)
Subjective: He feels better. He has no new complaints. His breathing is doing well by history. He is now 48 hours off the ventilator.  Objective: Vital signs in last 24 hours: Temp:  [97.5 F (36.4 C)-97.9 F (36.6 C)] 97.5 F (36.4 C) (10/16 0503) Pulse Rate:  [95-119] 95 (10/16 0503) Resp:  [13-19] 16 (10/16 0503) BP: (76-149)/(38-87) 130/87 mmHg (10/16 0503) SpO2:  [97 %-99 %] 99 % (10/16 0723) Weight change:  Last BM Date: 11/05/12  Intake/Output from previous day: 10/15 0701 - 10/16 0700 In: 832 [P.O.:720; I.V.:112] Out: 2050 [Urine:2050]  PHYSICAL EXAM General appearance: alert, cooperative, no distress and morbidly obese Resp: clear to auscultation bilaterally Cardio: regular rate and rhythm, S1, S2 normal, no murmur, click, rub or gallop GI: soft, non-tender; bowel sounds normal; no masses,  no organomegaly Extremities: extremities normal, atraumatic, no cyanosis or edema  Lab Results:    Basic Metabolic Panel:  Recent Labs  16/10/96 0525 11/06/12 0641  NA 139 140  K 3.9 4.0  CL 107 103  CO2 22 27  GLUCOSE 170* 158*  BUN 13 16  CREATININE 1.11 0.94  CALCIUM 9.1 9.4   Liver Function Tests: No results found for this basename: AST, ALT, ALKPHOS, BILITOT, PROT, ALBUMIN,  in the last 72 hours No results found for this basename: LIPASE, AMYLASE,  in the last 72 hours No results found for this basename: AMMONIA,  in the last 72 hours CBC:  Recent Labs  11/04/12 0525 11/06/12 0641  WBC 14.3* 19.5*  HGB 14.3 15.0  HCT 43.1 45.3  MCV 83.0 82.5  PLT 148* 163   Cardiac Enzymes:  Recent Labs  11/03/12 1557 11/03/12 2017  TROPONINI 0.33* <0.30   BNP: No results found for this basename: PROBNP,  in the last 72 hours D-Dimer: No results found for this basename: DDIMER,  in the last 72 hours CBG:  Recent Labs  11/03/12 2342 11/04/12 0354 11/04/12 0749 11/04/12 1141 11/04/12 1650 11/05/12 0746  GLUCAP 122* 149* 169* 154* 137* 149*    Hemoglobin A1C: No results found for this basename: HGBA1C,  in the last 72 hours Fasting Lipid Panel: No results found for this basename: CHOL, HDL, LDLCALC, TRIG, CHOLHDL, LDLDIRECT,  in the last 72 hours Thyroid Function Tests: No results found for this basename: TSH, T4TOTAL, FREET4, T3FREE, THYROIDAB,  in the last 72 hours Anemia Panel: No results found for this basename: VITAMINB12, FOLATE, FERRITIN, TIBC, IRON, RETICCTPCT,  in the last 72 hours Coagulation: No results found for this basename: LABPROT, INR,  in the last 72 hours Urine Drug Screen: Drugs of Abuse     Component Value Date/Time   LABOPIA NONE DETECTED 06/23/2012 0531   COCAINSCRNUR NONE DETECTED 06/23/2012 0531   LABBENZ NONE DETECTED 06/23/2012 0531   AMPHETMU NONE DETECTED 06/23/2012 0531   THCU NONE DETECTED 06/23/2012 0531   LABBARB NONE DETECTED 06/23/2012 0531    Alcohol Level: No results found for this basename: ETH,  in the last 72 hours Urinalysis: No results found for this basename: COLORURINE, APPERANCEUR, LABSPEC, PHURINE, GLUCOSEU, HGBUR, BILIRUBINUR, KETONESUR, PROTEINUR, UROBILINOGEN, NITRITE, LEUKOCYTESUR,  in the last 72 hours Misc. Labs:  ABGS  Recent Labs  11/04/12 1210  PHART 7.394  PO2ART 161.0*  TCO2 18.9  HCO3 21.7   CULTURES Recent Results (from the past 240 hour(s))  CULTURE, BLOOD (ROUTINE X 2)     Status: None   Collection Time    11/03/12 12:48 AM      Result  Value Range Status   Specimen Description BLOOD LEFT HAND   Final   Special Requests BOTTLES DRAWN AEROBIC AND ANAEROBIC 5CC   Final   Culture NO GROWTH 2 DAYS   Final   Report Status PENDING   Incomplete  CULTURE, BLOOD (ROUTINE X 2)     Status: None   Collection Time    11/03/12 12:55 AM      Result Value Range Status   Specimen Description BLOOD LEFT ANTECUBITAL   Final   Special Requests BOTTLES DRAWN AEROBIC AND ANAEROBIC 6CC   Final   Culture NO GROWTH 2 DAYS   Final   Report Status PENDING   Incomplete  URINE  CULTURE     Status: None   Collection Time    11/03/12  1:56 AM      Result Value Range Status   Specimen Description URINE, CATHETERIZED   Final   Special Requests NONE   Final   Culture  Setup Time     Final   Value: 11/03/2012 03:13     Performed at [facility] Count     Final   Value: NO GROWTH     Performed at [facility]   Culture     Final   Value: NO GROWTH     Performed at [facility]   Report Status 11/04/2012 FINAL   Final  MRSA PCR SCREENING     Status: None   Collection Time    11/03/12  2:58 AM      Result Value Range Status   MRSA by PCR NEGATIVE  NEGATIVE Final   Comment:            The GeneXpert MRSA Assay (FDA     approved for NASAL specimens     only), is one component of a     comprehensive MRSA colonization     surveillance program. It is not     intended to diagnose MRSA     infection nor to guide or     monitor treatment for     MRSA infections.   Studies/Results: No results found.  Medications:  Prior to Admission:  Prescriptions prior to admission  Medication Sig Dispense Refill  . albuterol (PROVENTIL) (2.5 MG/3ML) 0.083% nebulizer solution Take 2.5 mg by nebulization every 4 (four) hours as needed for wheezing or shortness of breath.      Marland Kitchen albuterol (PROVENTIL) (2.5 MG/3ML) 0.083% nebulizer solution Take 3 mLs (2.5 mg total) by nebulization every 6 (six) hours as needed for wheezing.  75 mL  12  . amLODipine (NORVASC) 5 MG tablet Take 5 mg by mouth daily.      . benztropine (COGENTIN) 1 MG tablet Take 1 mg by mouth at bedtime.      . ziprasidone (GEODON) 80 MG capsule Take 80 mg by mouth at bedtime.       Scheduled: . albuterol  2.5 mg Nebulization Q4H  . amLODipine  5 mg Oral Daily  . aspirin  81 mg Per NG tube Daily  . benztropine  1 mg Oral QHS  . budesonide-formoterol  2 puff Inhalation BID  . enoxaparin (LOVENOX) injection  40 mg Subcutaneous Q24H  . levofloxacin  500 mg Oral QHS  .  methylPREDNISolone (SOLU-MEDROL) injection  40 mg Intravenous Q12H  . sodium chloride  3 mL Intravenous Q12H  . tiotropium  18 mcg Inhalation Daily  . ziprasidone  80 mg Oral QHS   Continuous: .  sodium chloride 20 mL/hr at 11/05/12 0900   ZOX:[id number], acetaminophen, albuterol, ondansetron (ZOFRAN) IV, ondansetron  Assesment: He was admitted with acute respiratory failure. He has COPD and had acute exacerbation. He had a non-STEMI. His treatment is competent by his schizophrenia. Principal Problem:   Acute respiratory failure Active Problems:   COPD with acute exacerbation   Schizophrenia   Hypotension   NSTEMI (non-ST elevated myocardial infarction)    Plan: Continue with current treatments and medications. I have added long-acting bronchodilators which may help him at home    LOS: 3 days   Israa Caban L 11/06/2012, 8:38 AM

## 2012-11-06 NOTE — Progress Notes (Signed)
     Consulting cardiologist: Dr. Jonelle Sidle  Subjective:   No chest pain. Breathing is better. Has ambulated to a limited degree in his room.   Objective:   Temp:  [97.5 F (36.4 C)-97.9 F (36.6 C)] 97.5 F (36.4 C) (10/16 0503) Pulse Rate:  [95-119] 95 (10/16 0503) Resp:  [13-19] 16 (10/16 0503) BP: (76-149)/(38-87) 130/87 mmHg (10/16 0503) SpO2:  [97 %-99 %] 99 % (10/16 0723) Last BM Date: 11/05/12  Filed Weights   11/03/12 0320 11/05/12 0500  Weight: 264 lb 1.8 oz (119.8 kg) 267 lb 6.7 oz (121.3 kg)    Intake/Output Summary (Last 24 hours) at 11/06/12 0856 Last data filed at 11/06/12 0503  Gross per 24 hour  Intake    380 ml  Output   1650 ml  Net  -1270 ml    Telemetry: Sinus rhythm, heart rate increasing, sinus tachycardia noted.  Exam:  General: Comfortable, in no distress.  Lungs: Diminished breath sounds but no wheezing.  Cardiac: RRR, no gallop.  Extremities: No pitting edema.  Lab Results:  Basic Metabolic Panel:  Recent Labs Lab 11/03/12 0435 11/04/12 0525 11/06/12 0641  NA 141 139 140  K 4.0 3.9 4.0  CL 107 107 103  CO2 23 22 27   GLUCOSE 152* 170* 158*  BUN 17 13 16   CREATININE 1.34 1.11 0.94  CALCIUM 8.8 9.1 9.4    CBC:  Recent Labs Lab 11/03/12 0435 11/04/12 0525 11/06/12 0641  WBC 11.9* 14.3* 19.5*  HGB 14.5 14.3 15.0  HCT 44.7 43.1 45.3  MCV 84.3 83.0 82.5  PLT 172 148* 163    Cardiac Enzymes:  Recent Labs Lab 11/03/12 0836 11/03/12 1557 11/03/12 2017  TROPONINI 0.54* 0.33* <0.30     Medications:   Scheduled Medications: . albuterol  2.5 mg Nebulization Q4H  . amLODipine  5 mg Oral Daily  . aspirin  81 mg Per NG tube Daily  . benztropine  1 mg Oral QHS  . budesonide-formoterol  2 puff Inhalation BID  . enoxaparin (LOVENOX) injection  40 mg Subcutaneous Q24H  . levofloxacin  500 mg Oral QHS  . methylPREDNISolone (SOLU-MEDROL) injection  40 mg Intravenous Q12H  . sodium chloride  3 mL  Intravenous Q12H  . tiotropium  18 mcg Inhalation Daily  . ziprasidone  80 mg Oral QHS    Infusions: . sodium chloride 20 mL/hr at 11/05/12 0900    PRN Medications: acetaminophen, acetaminophen, albuterol, ondansetron (ZOFRAN) IV, ondansetron   Assessment:   1. NSTEMI, type II: Most likely related to physiologic stress with acute respiratory failure and COPD. Echocardiogram shows preserved LVEF without obvious wall motion abnormalities, grade 1 diastolic dysfunction. Troponin I peaked at only 0.54, now normal. Baseline heart rate increasing, he is not any rate lowering medications. Avoiding beta blocker with COPD.  2. COPD: Status post successful extubation, breathing comfortably at this time. Dr. Juanetta Gosling is managing pulmonary status with medication adjustments.   Plan/Discussion:    Continue aspirin. Will stop Norvasc and begin Cardizem CD in an effort to manage both blood pressure and provide general heart rate control for reduced possibility of cardiac stress and ischemia. Would also check a lipid panel.   Jonelle Sidle, M.D., F.A.C.C.

## 2012-11-06 NOTE — Progress Notes (Signed)
Subjective: Patient was successfully extubated yesterday. He is doing much better. His breathing is improving.  Objective: Vital signs in last 24 hours: Temp:  [97.5 F (36.4 C)-97.9 F (36.6 C)] 97.5 F (36.4 C) (10/16 0503) Pulse Rate:  [95-119] 95 (10/16 0503) Resp:  [13-19] 16 (10/16 0503) BP: (76-149)/(38-87) 130/87 mmHg (10/16 0503) SpO2:  [97 %-99 %] 99 % (10/16 0723) Weight change:  Last BM Date: 11/05/12  Intake/Output from previous day: 10/15 0701 - 10/16 0700 In: 832 [P.O.:720; I.V.:112] Out: 2050 [Urine:2050]  PHYSICAL EXAM General appearance: slowed mentation Resp: diminished breath sounds bilaterally Cardio: S1, S2 normal GI: soft, non-tender; bowel sounds normal; no masses,  no organomegaly Extremities: extremities normal, atraumatic, no cyanosis or edema  Lab Results:    @labtest @ [facility]  Recent Labs  11/04/12 1210  PHART 7.394  PO2ART 161.0*  TCO2 18.9  HCO3 21.7   CULTURES Recent Results (from the past 240 hour(s))  CULTURE, BLOOD (ROUTINE X 2)     Status: None   Collection Time    11/03/12 12:48 AM      Result Value Range Status   Specimen Description BLOOD LEFT HAND   Final   Special Requests BOTTLES DRAWN AEROBIC AND ANAEROBIC 5CC   Final   Culture NO GROWTH 2 DAYS   Final   Report Status PENDING   Incomplete  CULTURE, BLOOD (ROUTINE X 2)     Status: None   Collection Time    11/03/12 12:55 AM      Result Value Range Status   Specimen Description BLOOD LEFT ANTECUBITAL   Final   Special Requests BOTTLES DRAWN AEROBIC AND ANAEROBIC 6CC   Final   Culture NO GROWTH 2 DAYS   Final   Report Status PENDING   Incomplete  URINE CULTURE     Status: None   Collection Time    11/03/12  1:56 AM      Result Value Range Status   Specimen Description URINE, CATHETERIZED   Final   Special Requests NONE   Final   Culture  Setup Time     Final   Value: 11/03/2012 03:13     Performed at [facility] Count     Final   Value: NO  GROWTH     Performed at [facility]   Culture     Final   Value: NO GROWTH     Performed at [facility]   Report Status 11/04/2012 FINAL   Final  MRSA PCR SCREENING     Status: None   Collection Time    11/03/12  2:58 AM      Result Value Range Status   MRSA by PCR NEGATIVE  NEGATIVE Final   Comment:            The GeneXpert MRSA Assay (FDA     approved for NASAL specimens     only), is one component of a     comprehensive MRSA colonization     surveillance program. It is not     intended to diagnose MRSA     infection nor to guide or     monitor treatment for     MRSA infections.   Studies/Results: Dg Chest Port 1 View  11/04/2012   CLINICAL DATA:  Respiratory failure, history COPD, former smoker  EXAM: PORTABLE CHEST - 1 VIEW  COMPARISON:  Portable exam 0759 hr compared to 11/03/2012  FINDINGS: Tip of endotracheal tube projects 2.3 cm  above carinal.  Nasogastric tube extends into stomach.  Upper normal heart size.  Nor mediastinal contours and pulmonary vascularity.  Low lung volumes with bibasilar atelectasis.  Questionable left upper lobe infiltrate.  Remaining lungs clear.  No gross pleural effusion or pneumothorax.  IMPRESSION: Bibasilar atelectasis.  Question subtle left upper lobe infiltrate versus superimposed artifacts.   Electronically Signed   By: Ulyses Southward M.D.   On: 11/04/2012 08:25    Medications: I have reviewed the patient's current medications.  Assesment: Principal Problem:   Acute respiratory failure Active Problems:   COPD with acute exacerbation   Schizophrenia   Hypotension   NSTEMI (non-ST elevated myocardial infarction) S/P extubation   Plan:t Taper solumedrol to 40 mg iv BID Will D/C Iv fluid Ambulate patient Will monitor CB/BMP      LOS: 3 days   Terry [name] 11/06/2012, 8:03 AM

## 2012-11-07 ENCOUNTER — Encounter (HOSPITAL_COMMUNITY): Payer: Self-pay | Admitting: Emergency Medicine

## 2012-11-07 ENCOUNTER — Emergency Department (HOSPITAL_COMMUNITY)
Admission: EM | Admit: 2012-11-07 | Discharge: 2012-11-07 | Disposition: A | Discharge status: Home / Self Care | Payer: PRIVATE HEALTH INSURANCE | Attending: Emergency Medicine | Admitting: Emergency Medicine

## 2012-11-07 DIAGNOSIS — J441 Chronic obstructive pulmonary disease with (acute) exacerbation: Secondary | ICD-10-CM | POA: Insufficient documentation

## 2012-11-07 DIAGNOSIS — R0609 Other forms of dyspnea: Secondary | ICD-10-CM | POA: Insufficient documentation

## 2012-11-07 DIAGNOSIS — Z87891 Personal history of nicotine dependence: Secondary | ICD-10-CM | POA: Insufficient documentation

## 2012-11-07 DIAGNOSIS — F209 Schizophrenia, unspecified: Secondary | ICD-10-CM | POA: Insufficient documentation

## 2012-11-07 DIAGNOSIS — Z79899 Other long term (current) drug therapy: Secondary | ICD-10-CM | POA: Insufficient documentation

## 2012-11-07 DIAGNOSIS — R0600 Dyspnea, unspecified: Secondary | ICD-10-CM

## 2012-11-07 DIAGNOSIS — R0989 Other specified symptoms and signs involving the circulatory and respiratory systems: Secondary | ICD-10-CM | POA: Insufficient documentation

## 2012-11-07 LAB — CBC
HCT: 46.5 % (ref 39.0–52.0)
Hemoglobin: 15.5 g/dL (ref 13.0–17.0)
MCH: 27.5 pg (ref 26.0–34.0)
MCV: 82.4 fL (ref 78.0–100.0)
Platelets: 164 10*3/uL (ref 150–400)
RBC: 5.64 MIL/uL (ref 4.22–5.81)

## 2012-11-07 LAB — BASIC METABOLIC PANEL
BUN: 17 mg/dL (ref 6–23)
CO2: 29 mEq/L (ref 19–32)
Calcium: 9.3 mg/dL (ref 8.4–10.5)
Creatinine, Ser: 0.96 mg/dL (ref 0.50–1.35)
Glucose, Bld: 137 mg/dL — ABNORMAL HIGH (ref 70–99)
Potassium: 3.9 mEq/L (ref 3.5–5.1)

## 2012-11-07 LAB — LIPID PANEL
Cholesterol: 214 mg/dL — ABNORMAL HIGH (ref 0–200)
LDL Cholesterol: 123 mg/dL — ABNORMAL HIGH (ref 0–99)
VLDL: 18 mg/dL (ref 0–40)

## 2012-11-07 MED ORDER — ALBUTEROL SULFATE (5 MG/ML) 0.5% IN NEBU
5.0000 mg | INHALATION_SOLUTION | Freq: Once | RESPIRATORY_TRACT | Status: AC
  Administered 2012-11-07: 5 mg via RESPIRATORY_TRACT
  Filled 2012-11-07: qty 1

## 2012-11-07 MED ORDER — PREDNISONE (PAK) 10 MG PO TABS: 10.0000 mg | ORAL_TABLET | Freq: Every day | ORAL | Status: DC

## 2012-11-07 MED ORDER — PREDNISONE 50 MG PO TABS
60.0000 mg | ORAL_TABLET | ORAL | Status: AC
  Administered 2012-11-07: 60 mg via ORAL
  Filled 2012-11-07 (×2): qty 1

## 2012-11-07 MED ORDER — ALBUTEROL SULFATE (5 MG/ML) 0.5% IN NEBU: 5.0000 mg | INHALATION_SOLUTION | Freq: Once | RESPIRATORY_TRACT | Status: DC

## 2012-11-07 MED ORDER — LEVOFLOXACIN 500 MG PO TABS: 500.0000 mg | ORAL_TABLET | Freq: Every day | ORAL | Status: DC
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
## 2012-11-07 NOTE — ED Notes (Signed)
D/c today from [facility], Says he cont to be sob,  ADM  With COPD, with exacerbation.   Here because of cont sob, and says he needs  Air at home

## 2012-11-07 NOTE — ED Notes (Addendum)
Pt stating he needs air at home, pt gets SOB while talking, wheezing noted. Pt does not have albuterol at home yet just the atrovent last atrovent treatment was at 1800.

## 2012-11-07 NOTE — Progress Notes (Signed)
He is being set up for discharge. I think he would be held by having a long acting bronchodilators at home. I discussed this with the patient and he understands

## 2012-11-07 NOTE — Discharge Summary (Signed)
Physician Discharge Summary  Patient ID: Terry [name] MRN: [id number] DOB/AGE: 06-20-1955 57 y.o. Primary Care Physician:Kyasia Steuck, MD Admit date: 11/03/2012 Discharge date: 11/07/2012    Discharge Diagnoses:   Principal Problem:   Acute respiratory failure Active Problems:   COPD with acute exacerbation   Schizophrenia   Hypotension   NSTEMI (non-ST elevated myocardial infarction)     Medication List         albuterol (2.5 MG/3ML) 0.083% nebulizer solution  Commonly known as:  PROVENTIL  Take 3 mLs (2.5 mg total) by nebulization every 6 (six) hours as needed for wheezing.     albuterol (2.5 MG/3ML) 0.083% nebulizer solution  Commonly known as:  PROVENTIL  Take 2.5 mg by nebulization every 4 (four) hours as needed for wheezing or shortness of breath.     amLODipine 5 MG tablet  Commonly known as:  NORVASC  Take 5 mg by mouth daily.     benztropine 1 MG tablet  Commonly known as:  COGENTIN  Take 1 mg by mouth at bedtime.     levofloxacin 500 MG tablet  Commonly known as:  LEVAQUIN  Take 1 tablet (500 mg total) by mouth at bedtime.     predniSONE 10 MG tablet  Commonly known as:  STERAPRED UNI-PAK  Take 1 tablet (10 mg total) by mouth daily. 4 tab po daily for 3 days then 3 tab po for 3 days then 2 tab po for 3 days then 1 tab po for 3 days then D/C     ziprasidone 80 MG capsule  Commonly known as:  GEODON  Take 80 mg by mouth at bedtime.        Discharged Condition: improved    Consults: pulmonary  Significant Diagnostic Studies: Dg Neck Soft Tissue  11/03/2012   CLINICAL DATA:  Abnormal chest radiograph.  EXAM: NECK SOFT TISSUES - 1+ VIEW  COMPARISON:  None.  FINDINGS: Nasogastric tube and ET tubes are identified. There is no evidence of retropharyngeal soft tissue swelling or epiglottic enlargement. No subcutaneous emphysema identified.  IMPRESSION: 1.  NG tube and ET tubes in place.  2. No subcutaneous emphysema identified.   Electronically  Signed   By: Signa Kell M.D.   On: 11/03/2012 11:31   Ct Head Wo Contrast  11/03/2012   CLINICAL DATA:  Altered mental status.  Collapsed and unresponsive.  EXAM: CT HEAD WITHOUT CONTRAST  TECHNIQUE: Contiguous axial images were obtained from the base of the skull through the vertex without intravenous contrast.  COMPARISON:  None.  FINDINGS: Skull:No acute osseous abnormality. No lytic or blastic lesion.  Orbits: No acute abnormality.  Sinuses: Scattered inflammatory mucosal thickening. Nasopharyngeal fluid related to intubation.  Brain: No definitive evidence of acute abnormality, such as acute gray matter infarction, hemorrhage, hydrocephalus, or mass lesion/mass effect. Relatively ill-defined low attenuation in the posterior limb left internal capsule.  IMPRESSION: 1. No evidence of acute abnormality, including hemorrhage or large territory ischemia. 2. Nonspecific white matter low-attenuation in the left internal capsule, often related to chronic small vessel ischemia.   Electronically Signed   By: Tiburcio Pea M.D.   On: 11/03/2012 03:19   Dg Chest 1v Repeat Same Day  11/03/2012   CLINICAL DATA:  Respiratory failure  EXAM: CHEST - 1 VIEW SAME DAY  COMPARISON:  11/03/2012  FINDINGS: Endotracheal tube tip is above the carina. There is an enteric tube in place. The heart size appears normal. No pleural effusion or edema. Lung volumes appear low. No  airspace consolidation.  IMPRESSION: 1. Low lung volumes.  2. Endotracheal tube tip is in satisfactory position above the carina   Electronically Signed   By: Signa Kell M.D.   On: 11/03/2012 09:24   Dg Chest Port 1 View  11/04/2012   CLINICAL DATA:  Respiratory failure, history COPD, former smoker  EXAM: PORTABLE CHEST - 1 VIEW  COMPARISON:  Portable exam 0759 hr compared to 11/03/2012  FINDINGS: Tip of endotracheal tube projects 2.3 cm above carinal.  Nasogastric tube extends into stomach.  Upper normal heart size.  Nor mediastinal contours and  pulmonary vascularity.  Low lung volumes with bibasilar atelectasis.  Questionable left upper lobe infiltrate.  Remaining lungs clear.  No gross pleural effusion or pneumothorax.  IMPRESSION: Bibasilar atelectasis.  Question subtle left upper lobe infiltrate versus superimposed artifacts.   Electronically Signed   By: Ulyses Southward M.D.   On: 11/04/2012 08:25   Dg Chest Portable 1 View  11/03/2012   *RADIOLOGY REPORT*  Clinical Data: Bronchitis, neck surgery.  PORTABLE CHEST - 1 VIEW  Comparison: Chest radiograph June 23, 2012.  Findings: Cardiomediastinal silhouette is unremarkable for this low inspiratory portable examination with crowded vasculature markings. The lungs are clear without pleural effusions or focal consolidations.  Trachea projects midline and there is no pneumothorax.  Endotracheal tube tip projects 2.2 cm above the carina. Multiple EKG lines overlay the patient and could obscure underlying subtle pathology.  Suspected subcutaneous gas in the right neck.  IMPRESSION: No acute cardiopulmonary process.  Endotracheal tube tip projects 2.3 cm above the carina.  Suspected right neck subcutaneous gas, recommend correlation with date of reported neck surgery.   Original Report Authenticated By: Awilda Metro    Lab Results: Basic Metabolic Panel:  Recent Labs  14/78/29 0641 11/07/12 0630  NA 140 140  K 4.0 3.9  CL 103 102  CO2 27 29  GLUCOSE 158* 137*  BUN 16 17  CREATININE 0.94 0.96  CALCIUM 9.4 9.3   Liver Function Tests: No results found for this basename: AST, ALT, ALKPHOS, BILITOT, PROT, ALBUMIN,  in the last 72 hours   CBC:  Recent Labs  11/06/12 0641 11/07/12 0630  WBC 19.5* 19.0*  HGB 15.0 15.5  HCT 45.3 46.5  MCV 82.5 82.4  PLT 163 164    Recent Results (from the past 240 hour(s))  CULTURE, BLOOD (ROUTINE X 2)     Status: None   Collection Time    11/03/12 12:48 AM      Result Value Range Status   Specimen Description BLOOD LEFT HAND   Final    Special Requests BOTTLES DRAWN AEROBIC AND ANAEROBIC 5CC   Final   Culture NO GROWTH 3 DAYS   Final   Report Status PENDING   Incomplete  CULTURE, BLOOD (ROUTINE X 2)     Status: None   Collection Time    11/03/12 12:55 AM      Result Value Range Status   Specimen Description BLOOD LEFT ANTECUBITAL   Final   Special Requests BOTTLES DRAWN AEROBIC AND ANAEROBIC 6CC   Final   Culture NO GROWTH 3 DAYS   Final   Report Status PENDING   Incomplete  URINE CULTURE     Status: None   Collection Time    11/03/12  1:56 AM      Result Value Range Status   Specimen Description URINE, CATHETERIZED   Final   Special Requests NONE   Final   Culture  Setup  Time     Final   Value: 11/03/2012 03:13     Performed at [facility] Count     Final   Value: NO GROWTH     Performed at [facility]   Culture     Final   Value: NO GROWTH     Performed at [facility]   Report Status 11/04/2012 FINAL   Final  MRSA PCR SCREENING     Status: None   Collection Time    11/03/12  2:58 AM      Result Value Range Status   MRSA by PCR NEGATIVE  NEGATIVE Final   Comment:            The GeneXpert MRSA Assay (FDA     approved for NASAL specimens     only), is one component of a     comprehensive MRSA colonization     surveillance program. It is not     intended to diagnose MRSA     infection nor to guide or     monitor treatment for     MRSA infections.     Hospital Course:  This is a 57 years old male patient with history of multiple medical illnesses including COPD was admitted due to respiratory failure. On admission he was intubated and was supported Vent. He improved and successfully extubated. He is back to his baseline. He will be discharged on oral antibiotics and tapering dose of steroid.   Discharge Exam: Blood pressure 131/80, pulse 75, temperature 97.4 F (36.3 C), temperature source Oral, resp. rate 16, height 5\' 8"  (1.727 m), weight 121.3 kg (267 lb 6.7 oz),  SpO2 99.00%.   Disposition:  home        Follow-up Information   Follow up with [facility], MD In 2 weeks.   Specialty:  Internal Medicine   Contact information:   [address] [place] [address] [phone]       Signed: Avon Gully  11/07/2012, 7:56 AM

## 2012-11-07 NOTE — Progress Notes (Signed)
Pt discharged with instructions and care notes and prescriptions.  Pt verbalized understanding.  The patient left the floor via w/c with staff in stable condition.   O2 titration to assess if the patient would require O2 was performed prior to discharge.  His O2 sats at rest was 99%.  Amy Robeson with CM was notified.

## 2012-11-07 NOTE — ED Notes (Signed)
Pt alert & oriented x4, stable gait. Patient given discharge instructions, paperwork & prescription(s). Patient  instructed to stop at the registration desk to finish any additional paperwork. Patient verbalized understanding. Pt left department w/ no further questions. 

## 2012-11-08 LAB — CULTURE, BLOOD (ROUTINE X 2): Culture: NO GROWTH

## 2012-11-27 ENCOUNTER — Emergency Department (HOSPITAL_COMMUNITY): Payer: PRIVATE HEALTH INSURANCE

## 2012-11-27 ENCOUNTER — Encounter (HOSPITAL_COMMUNITY): Payer: Self-pay | Admitting: Emergency Medicine

## 2012-11-27 ENCOUNTER — Emergency Department (HOSPITAL_COMMUNITY)
Admission: EM | Admit: 2012-11-27 | Discharge: 2012-11-27 | Disposition: A | Discharge status: Home / Self Care | Payer: PRIVATE HEALTH INSURANCE | Attending: Emergency Medicine | Admitting: Emergency Medicine

## 2012-11-27 DIAGNOSIS — M7989 Other specified soft tissue disorders: Secondary | ICD-10-CM | POA: Insufficient documentation

## 2012-11-27 DIAGNOSIS — Z87891 Personal history of nicotine dependence: Secondary | ICD-10-CM | POA: Insufficient documentation

## 2012-11-27 DIAGNOSIS — J441 Chronic obstructive pulmonary disease with (acute) exacerbation: Secondary | ICD-10-CM | POA: Insufficient documentation

## 2012-11-27 DIAGNOSIS — Z79899 Other long term (current) drug therapy: Secondary | ICD-10-CM | POA: Insufficient documentation

## 2012-11-27 DIAGNOSIS — J4489 Other specified chronic obstructive pulmonary disease: Secondary | ICD-10-CM | POA: Insufficient documentation

## 2012-11-27 DIAGNOSIS — R5381 Other malaise: Secondary | ICD-10-CM | POA: Insufficient documentation

## 2012-11-27 DIAGNOSIS — R0602 Shortness of breath: Secondary | ICD-10-CM

## 2012-11-27 DIAGNOSIS — F209 Schizophrenia, unspecified: Secondary | ICD-10-CM | POA: Insufficient documentation

## 2012-11-27 DIAGNOSIS — R079 Chest pain, unspecified: Secondary | ICD-10-CM | POA: Insufficient documentation

## 2012-11-27 DIAGNOSIS — R000 Tachycardia, unspecified: Secondary | ICD-10-CM | POA: Insufficient documentation

## 2012-11-27 DIAGNOSIS — R209 Unspecified disturbances of skin sensation: Secondary | ICD-10-CM | POA: Insufficient documentation

## 2012-11-27 DIAGNOSIS — J449 Chronic obstructive pulmonary disease, unspecified: Secondary | ICD-10-CM | POA: Insufficient documentation

## 2012-11-27 LAB — GLUCOSE, CAPILLARY: Glucose-Capillary: 111 mg/dL — ABNORMAL HIGH (ref 70–99)

## 2012-11-27 NOTE — ED Notes (Signed)
During end of triage patient states "I feel like I can talk better now." When asked patient denied slurred speech but reported numbness around mouth. Patient reports feeling stronger in legs as well but had denied weakness earlier. EDP made aware.

## 2012-11-27 NOTE — ED Notes (Signed)
Patient c/o shortness of breath with exertion and tingling in right leg. Denies any weakness. Reports using inhaler 2 hours ago with no relief. Denies any shortness of breath with rest.
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
## 2012-12-12 ENCOUNTER — Encounter (HOSPITAL_COMMUNITY): Payer: Self-pay | Admitting: Emergency Medicine

## 2012-12-12 ENCOUNTER — Emergency Department (HOSPITAL_COMMUNITY)
Admission: EM | Admit: 2012-12-12 | Discharge: 2012-12-12 | Disposition: A | Discharge status: Home / Self Care | Payer: PRIVATE HEALTH INSURANCE | Attending: Emergency Medicine | Admitting: Emergency Medicine

## 2012-12-12 DIAGNOSIS — R0989 Other specified symptoms and signs involving the circulatory and respiratory systems: Secondary | ICD-10-CM | POA: Insufficient documentation

## 2012-12-12 DIAGNOSIS — Z87891 Personal history of nicotine dependence: Secondary | ICD-10-CM | POA: Insufficient documentation

## 2012-12-12 DIAGNOSIS — R0609 Other forms of dyspnea: Secondary | ICD-10-CM | POA: Insufficient documentation

## 2012-12-12 DIAGNOSIS — R0600 Dyspnea, unspecified: Secondary | ICD-10-CM

## 2012-12-12 DIAGNOSIS — J4489 Other specified chronic obstructive pulmonary disease: Secondary | ICD-10-CM | POA: Insufficient documentation

## 2012-12-12 DIAGNOSIS — Z79899 Other long term (current) drug therapy: Secondary | ICD-10-CM | POA: Insufficient documentation

## 2012-12-12 DIAGNOSIS — F209 Schizophrenia, unspecified: Secondary | ICD-10-CM | POA: Insufficient documentation

## 2012-12-12 DIAGNOSIS — J449 Chronic obstructive pulmonary disease, unspecified: Secondary | ICD-10-CM | POA: Insufficient documentation

## 2012-12-12 MED ORDER — IPRATROPIUM BROMIDE 0.02 % IN SOLN
0.5000 mg | Freq: Once | RESPIRATORY_TRACT | Status: AC
  Administered 2012-12-12: 0.5 mg via RESPIRATORY_TRACT
  Filled 2012-12-12: qty 2.5

## 2012-12-12 MED ORDER — ALBUTEROL SULFATE (5 MG/ML) 0.5% IN NEBU
5.0000 mg | INHALATION_SOLUTION | Freq: Once | RESPIRATORY_TRACT | Status: AC
  Administered 2012-12-12: 5 mg via RESPIRATORY_TRACT
  Filled 2012-12-12: qty 1

## 2012-12-12 NOTE — ED Notes (Addendum)
Pt c/o sob since earlier today. Pt has taken 3 nebs and inhaler with minimal relief. Pt also has a history of anxiety.

## 2012-12-12 NOTE — ED Notes (Signed)
Duoneb complete. Pt states "I'm feeling better." Lung sounds clear bil
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
## 2012-12-12 NOTE — ED Notes (Signed)
Pt states "I ate three hot dogs not that long ago too, and when I eat late this tends to happen"

## 2012-12-12 NOTE — ED Notes (Signed)
Respiratory paged for duoneb 

## 2012-12-12 NOTE — ED Notes (Signed)
Lung sounds clear prior to neb tx

## 2012-12-18 ENCOUNTER — Emergency Department (HOSPITAL_COMMUNITY): Payer: PRIVATE HEALTH INSURANCE

## 2012-12-18 ENCOUNTER — Emergency Department (HOSPITAL_COMMUNITY)
Admission: EM | Admit: 2012-12-18 | Discharge: 2012-12-18 | Disposition: A | Discharge status: Home / Self Care | Payer: PRIVATE HEALTH INSURANCE | Attending: Emergency Medicine | Admitting: Emergency Medicine

## 2012-12-18 ENCOUNTER — Encounter (HOSPITAL_COMMUNITY): Payer: Self-pay | Admitting: Emergency Medicine

## 2012-12-18 ENCOUNTER — Other Ambulatory Visit: Payer: Self-pay

## 2012-12-18 DIAGNOSIS — G2402 Drug induced acute dystonia: Secondary | ICD-10-CM | POA: Insufficient documentation

## 2012-12-18 DIAGNOSIS — IMO0002 Reserved for concepts with insufficient information to code with codable children: Secondary | ICD-10-CM | POA: Insufficient documentation

## 2012-12-18 DIAGNOSIS — J441 Chronic obstructive pulmonary disease with (acute) exacerbation: Secondary | ICD-10-CM | POA: Insufficient documentation

## 2012-12-18 DIAGNOSIS — Z87891 Personal history of nicotine dependence: Secondary | ICD-10-CM | POA: Insufficient documentation

## 2012-12-18 DIAGNOSIS — Z79899 Other long term (current) drug therapy: Secondary | ICD-10-CM | POA: Insufficient documentation

## 2012-12-18 DIAGNOSIS — F209 Schizophrenia, unspecified: Secondary | ICD-10-CM | POA: Insufficient documentation

## 2012-12-18 DIAGNOSIS — R002 Palpitations: Secondary | ICD-10-CM | POA: Insufficient documentation

## 2012-12-18 DIAGNOSIS — F411 Generalized anxiety disorder: Secondary | ICD-10-CM | POA: Insufficient documentation

## 2012-12-18 LAB — TROPONIN I: Troponin I: <0.3 ng/mL (ref <0.30)

## 2012-12-18 LAB — BASIC METABOLIC PANEL
BUN: 8 mg/dL (ref 6–23)
CO2: 25 mEq/L (ref 19–32)
Calcium: 9.5 mg/dL (ref 8.4–10.5)
Chloride: 106 mEq/L (ref 96–112)
Glucose, Bld: 131 mg/dL — ABNORMAL HIGH (ref 70–99)
Potassium: 3.6 mEq/L (ref 3.5–5.1)
Sodium: 141 mEq/L (ref 135–145)

## 2012-12-18 LAB — CBC WITH DIFFERENTIAL/PLATELET
Basophils Absolute: 0 10*3/uL (ref 0.0–0.1)
Eosinophils Relative: 1 % (ref 0–5)
Lymphocytes Relative: 26 % (ref 12–46)
Lymphs Abs: 1.6 10*3/uL (ref 0.7–4.0)
MCV: 81 fL (ref 78.0–100.0)
Monocytes Relative: 6 % (ref 3–12)
Neutro Abs: 4.1 10*3/uL (ref 1.7–7.7)
Neutrophils Relative %: 67 % (ref 43–77)
Platelets: 200 10*3/uL (ref 150–400)
RBC: 5.26 MIL/uL (ref 4.22–5.81)
WBC: 6.2 10*3/uL (ref 4.0–10.5)

## 2012-12-18 MED ORDER — IPRATROPIUM BROMIDE 0.02 % IN SOLN
0.5000 mg | Freq: Once | RESPIRATORY_TRACT | Status: AC
  Administered 2012-12-18: 0.5 mg via RESPIRATORY_TRACT
  Filled 2012-12-18: qty 2.5

## 2012-12-18 MED ORDER — ALBUTEROL SULFATE (5 MG/ML) 0.5% IN NEBU
5.0000 mg | INHALATION_SOLUTION | Freq: Once | RESPIRATORY_TRACT | Status: AC
  Administered 2012-12-18: 5 mg via RESPIRATORY_TRACT
  Filled 2012-12-18: qty 1

## 2012-12-18 MED ORDER — ALBUTEROL SULFATE HFA 108 (90 BASE) MCG/ACT IN AERS: 2.0000 | INHALATION_SPRAY | RESPIRATORY_TRACT | Status: DC | PRN

## 2012-12-18 MED ORDER — PREDNISONE 20 MG PO TABS: 60.0000 mg | ORAL_TABLET | Freq: Every day | ORAL | Status: DC

## 2012-12-18 MED ORDER — LORAZEPAM 1 MG PO TABS
1.0000 mg | ORAL_TABLET | Freq: Once | ORAL | Status: AC
  Administered 2012-12-18: 1 mg via ORAL
  Filled 2012-12-18: qty 1

## 2012-12-18 MED ORDER — DIPHENHYDRAMINE HCL 50 MG/ML IJ SOLN
25.0000 mg | Freq: Once | INTRAMUSCULAR | Status: AC
  Administered 2012-12-18: 25 mg via INTRAVENOUS
  Filled 2012-12-18: qty 1

## 2012-12-18 MED ORDER — SODIUM CHLORIDE 0.9 % IV BOLUS (SEPSIS)
1000.0000 mL | Freq: Once | INTRAVENOUS | Status: AC
  Administered 2012-12-18: 1000 mL via INTRAVENOUS

## 2012-12-18 MED ORDER — LORAZEPAM 2 MG/ML IJ SOLN
1.0000 mg | Freq: Once | INTRAMUSCULAR | Status: AC
  Administered 2012-12-18: 1 mg via INTRAVENOUS
  Filled 2012-12-18: qty 1

## 2012-12-18 MED ORDER — PREDNISONE 50 MG PO TABS
60.0000 mg | ORAL_TABLET | Freq: Once | ORAL | Status: AC
  Administered 2012-12-18: 60 mg via ORAL
  Filled 2012-12-18 (×2): qty 1
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
## 2012-12-18 NOTE — ED Notes (Signed)
Pt reporting lessening in anxiety.  Reports improved speech "feeling better".  Will infuse fluid bolus prior to discharge per MD instructions.

## 2012-12-18 NOTE — ED Notes (Signed)
Pt reporting continued anxiety.  Pt remains slightly tachycardic. No distress noted. EDP aware.

## 2012-12-18 NOTE — ED Notes (Signed)
Report given to J. Johnson, RN. 

## 2012-12-18 NOTE — ED Notes (Signed)
Onset of anxiety after discharge from here this morning. Started feeling SOB which resolved with rescue coaching hyperventilation. Now breathing is better, but still feels anxious

## 2012-12-18 NOTE — ED Provider Notes (Signed)
TIME SEEN: 10:45 AM  CHIEF COMPLAINT: SOB  HPI:  Terry [name] is a 57 y.o. male brought in by ambulance with a h/o COPD and schizophrenia who presents to the Emergency Department complaining of SOB onset at 3am while watching TV. He has had a cold the last few days that has mostly resolved. He had a dry cough and nasal congestion. He uses an inhaler and a nebulizer machine. He last used the machine at 3am this morning with some relief. Denies any chest pain, lower extremity pain or swelling. He states this shortness of breath no similar to his prior COPD exacerbations.  He also reports anxiety. He feels like a schizophrenia is well-controlled. He has not missed any medications. Denies any hallucinations, suicidal or homicidal ideation.   ROS: See HPI Constitutional: no fever  Eyes: no drainage  ENT: no runny nose   Cardiovascular:  no chest pain  Resp: SOB  GI: no vomiting GU: no dysuria Integumentary: no rash  Allergy: no hives  Musculoskeletal: no leg swelling  Neurological: no slurred speech ROS otherwise negative  PAST MEDICAL HISTORY/PAST SURGICAL HISTORY:  Past Medical History  Diagnosis Date  . Schizophrenia   . COPD (chronic obstructive pulmonary disease)     MEDICATIONS:  Prior to Admission medications   Medication Sig Start Date End Date Taking? Authorizing Provider  albuterol (PROVENTIL HFA;VENTOLIN HFA) 108 (90 BASE) MCG/ACT inhaler Inhale 2 puffs into the lungs every 6 (six) hours as needed for wheezing.    Historical Provider, MD  benztropine (COGENTIN) 1 MG tablet Take 1 mg by mouth at bedtime.    Historical Provider, MD  ipratropium (ATROVENT) 0.02 % nebulizer solution Take 500 mcg by nebulization daily as needed for wheezing.    Historical Provider, MD  ziprasidone (GEODON) 80 MG capsule Take 80 mg by mouth at bedtime.    Historical Provider, MD    ALLERGIES:  No Known Allergies  SOCIAL HISTORY:  History  Substance Use Topics  . Smoking status: Former  Smoker -- 30 years    Types: Cigarettes    Quit date: 11/28/2006  . Smokeless tobacco: Never Used  . Alcohol Use: No    FAMILY HISTORY: Family History  Problem Relation Age of Onset  . Diabetes Father     EXAM: BP 118/85  Pulse 104  Temp(Src) 98.6 F (37 C) (Oral)  Resp 22  SpO2 95% CONSTITUTIONAL: Alert and oriented and responds appropriately to questions. Well-appearing; well-nourished HEAD: Normocephalic EYES: Conjunctivae clear, PERRL ENT: normal nose; no rhinorrhea; moist mucous membranes; pharynx without lesions noted NECK: Supple, no meningismus, no LAD  CARD: RRR; S1 and S2 appreciated; no murmurs, no clicks, no rubs, no gallops RESP: Normal chest excursion without splinting; breath sounds equal bilaterally but diminished at bases with decreased aeration, expiratory wheezing in his upper lobes bilaterally, no rhonchi or rales, slightly tachypneic ABD/GI: Normal bowel sounds; non-distended; soft, non-tender, no rebound, no guarding BACK:  The back appears normal and is non-tender to palpation, there is no CVA tenderness EXT: Normal ROM in all joints; non-tender to palpation; no edema; normal capillary refill; no cyanosis    SKIN: Normal color for age and race; warm NEURO: Moves all extremities equally PSYCH: The patient's mood and manner are appropriate. Grooming and personal hygiene are appropriate.  MEDICAL DECISION MAKING:  10:56 AM- Suspect mild COPD exacerbation. Will check labs including troponin to rule out that this is not patient's anginal equivalent. Given symptoms started at 3 AM, if negative, I do  not feel he needs serial sets of enzymes. We'll also obtain chest x-ray given his recent illness to rule out for infiltrate. Will give DuoNeb, prednisone and reassess. As for patient's anxiety, I recommended that he followup with his psychiatrist and primary care provider for further management for this. He has no current psychiatric safety concerns.  ED PROGRESS: Pt  aeration has improved after one neb.  Now scattered wheezing.  Will repeat neb.  Pt reports feeling much better.  CXR clear.  Labs pending.   Labs are unremarkable. Patient's lungs are now completely clear to auscultation. We'll discharge home with refill for his albuterol inhaler and steroid burst. Given return precautions. Patient has primary care for followup. Patient and family at bedside verbalize understanding and are comfortable with plan   EKG Interpretation    Date/Time:  Thursday December 18 2012 10:52:42 EST Ventricular Rate:  103 PR Interval:  148 QRS Duration: 80 QT Interval:  346 QTC Calculation: 453 R Axis:   11 Text Interpretation:  Sinus tachycardia Possible Anterior infarct , age undetermined Abnormal ECG When compared with ECG of 27-Nov-2012 10:04, No significant change was found Confirmed by Diyari Cherne  DO, Janaisha Tolsma [phone]) on 12/18/2012 11:17:53 AM             Layla Maw Rakeisha Nyce, DO 12/18/12 1258

## 2012-12-18 NOTE — ED Notes (Signed)
Pt reports was watching TV and became SOB.  Says tries to control his breathing.  Reports has frequent episodes of SOB.  Pt c/o anxiety.

## 2012-12-24 ENCOUNTER — Encounter (HOSPITAL_COMMUNITY): Payer: Self-pay | Admitting: Emergency Medicine

## 2012-12-24 ENCOUNTER — Emergency Department (HOSPITAL_COMMUNITY)
Admission: EM | Admit: 2012-12-24 | Discharge: 2012-12-24 | Disposition: A | Discharge status: Home / Self Care | Payer: PRIVATE HEALTH INSURANCE | Attending: Emergency Medicine | Admitting: Emergency Medicine

## 2012-12-24 DIAGNOSIS — Z87891 Personal history of nicotine dependence: Secondary | ICD-10-CM | POA: Insufficient documentation

## 2012-12-24 DIAGNOSIS — R0602 Shortness of breath: Secondary | ICD-10-CM | POA: Insufficient documentation

## 2012-12-24 DIAGNOSIS — J441 Chronic obstructive pulmonary disease with (acute) exacerbation: Secondary | ICD-10-CM | POA: Insufficient documentation

## 2012-12-24 DIAGNOSIS — F410 Panic disorder [episodic paroxysmal anxiety] without agoraphobia: Secondary | ICD-10-CM

## 2012-12-24 DIAGNOSIS — Z79899 Other long term (current) drug therapy: Secondary | ICD-10-CM | POA: Insufficient documentation

## 2012-12-24 DIAGNOSIS — F209 Schizophrenia, unspecified: Secondary | ICD-10-CM | POA: Insufficient documentation

## 2012-12-24 MED ORDER — LORAZEPAM 1 MG PO TABS
1.0000 mg | ORAL_TABLET | Freq: Once | ORAL | Status: AC
  Administered 2012-12-24: 1 mg via ORAL
  Filled 2012-12-24: qty 1

## 2012-12-24 NOTE — ED Notes (Signed)
Patient states that he is going to see his doctor tomorrow. That he hopes he will give him something for his nerves.
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
## 2012-12-24 NOTE — ED Notes (Signed)
Patient reports nervousness and felt as if he was having an anxiety attack. Reports used neb treatment at home, but had some increasing anxiety and shortness of breath after treatment.

## 2012-12-24 NOTE — ED Provider Notes (Signed)
Medical screening examination/treatment/procedure(s) were performed by non-physician practitioner and as supervising physician I was immediately available for consultation/collaboration.  EKG Interpretation   None         Jill Ruppe L Jazmina Muhlenkamp, MD 12/24/12 2327 

## 2012-12-31 ENCOUNTER — Other Ambulatory Visit (HOSPITAL_COMMUNITY): Payer: Self-pay | Admitting: Pulmonary Disease

## 2012-12-31 DIAGNOSIS — R55 Syncope and collapse: Secondary | ICD-10-CM

## 2012-12-31 DIAGNOSIS — R0602 Shortness of breath: Secondary | ICD-10-CM

## 2013-01-01 ENCOUNTER — Ambulatory Visit (HOSPITAL_COMMUNITY)
Admission: RE | Admit: 2013-01-01 | Discharge: 2013-01-01 | Disposition: A | Discharge status: Home / Self Care | Payer: PRIVATE HEALTH INSURANCE | Source: Ambulatory Visit | Attending: Pulmonary Disease | Admitting: Pulmonary Disease

## 2013-01-01 DIAGNOSIS — R0602 Shortness of breath: Secondary | ICD-10-CM | POA: Insufficient documentation

## 2013-01-01 DIAGNOSIS — K449 Diaphragmatic hernia without obstruction or gangrene: Secondary | ICD-10-CM | POA: Insufficient documentation

## 2013-01-01 DIAGNOSIS — D3500 Benign neoplasm of unspecified adrenal gland: Secondary | ICD-10-CM | POA: Insufficient documentation

## 2013-01-01 DIAGNOSIS — R55 Syncope and collapse: Secondary | ICD-10-CM

## 2013-01-01 MED ORDER — SODIUM CHLORIDE 0.9 % IJ SOLN
INTRAMUSCULAR | Status: AC
  Filled 2013-01-01: qty 500

## 2013-01-01 MED ORDER — IOHEXOL 350 MG/ML SOLN
100.0000 mL | Freq: Once | INTRAVENOUS | Status: AC | PRN
  Administered 2013-01-01: 100 mL via INTRAVENOUS

## 2013-02-13 ENCOUNTER — Ambulatory Visit (HOSPITAL_COMMUNITY): Payer: PRIVATE HEALTH INSURANCE | Admitting: Psychiatry

## 2013-02-19 ENCOUNTER — Encounter (HOSPITAL_COMMUNITY): Payer: Self-pay | Admitting: Psychiatry

## 2013-02-19 ENCOUNTER — Ambulatory Visit (INDEPENDENT_AMBULATORY_CARE_PROVIDER_SITE_OTHER): Payer: PRIVATE HEALTH INSURANCE | Admitting: Psychiatry

## 2013-02-19 VITALS — BP 110/70 | Ht 68.0 in | Wt 256.0 lb

## 2013-02-19 DIAGNOSIS — F200 Paranoid schizophrenia: Secondary | ICD-10-CM

## 2013-02-19 MED ORDER — BENZTROPINE MESYLATE 1 MG PO TABS: 1.0000 mg | ORAL_TABLET | Freq: Every day | ORAL | Status: DC

## 2013-02-19 MED ORDER — ZIPRASIDONE HCL 80 MG PO CAPS: 80.0000 mg | ORAL_CAPSULE | Freq: Every day | ORAL | Status: DC

## 2013-02-19 MED ORDER — HYDROXYZINE HCL 25 MG PO TABS: 25.0000 mg | ORAL_TABLET | Freq: Three times a day (TID) | ORAL | Status: DC

## 2013-02-19 NOTE — Progress Notes (Signed)
Psychiatric Assessment Adult  Patient Identification:  Terry [name] Date of Evaluation:  02/19/2013 Chief Complaint: "I've had schizophrenia and my mom wants me to get a new Dr." History of Chief Complaint:   Chief Complaint  Patient presents with  . Schizophrenia  . Anxiety  . Establish Care    Anxiety Symptoms include confusion, nervous/anxious behavior and shortness of breath.     this patient is a 58 year old widowed black male who lives with his mother 51 year old son his niece and her 3 children in [place]. He is on disability for mental illness. He was referred by his mother who is also a patient here.  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 He did finish high school and went into the [employer] for 4 years. 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  About 10 years ago the patient's wife died of stomach cancer. After that he began to decompensate. He got depressed began hearing voices. He had ideas of reference like thinking .the TV and radio were talking to him. He was also quite paranoid. He was hospitalized in [place] for 6 weeks and diagnosed with schizophrenia he has been on and Cogentin ever since.  Recently the patient has become increasingly anxious and had panic attacks. [facility] physician [facility] started him on hydroxyzine which has been helpful and he hasn't had any panic attacks in the last 6 weeks. He denies being currently depressed or hearing any voices or ideas of reference. He did make mention of some odd delusions such as thinking he has won the [employer].he was about to cancel his Medicaid insurance because he is now rich and I warned him that this is probably a scam . He seemed to accept my explanation. He claims he is sleeping well. He does have a history of problems with breathing  and COPD. He denies other medical problems but I noted his blood glucose has been elevated the last several times. He is followed by Dr. Legrand Rams but this has not been brought to his attention.  Review of Systems  Constitutional: Negative.   HENT: Negative.   Eyes: Positive for visual disturbance.  Respiratory: Positive for shortness of breath.   Cardiovascular: Negative.   Gastrointestinal: Negative.   Endocrine: Negative.   Genitourinary: Negative.   Musculoskeletal: Negative.   Allergic/Immunologic: Negative.   Neurological: Negative.   Hematological: Negative.   Psychiatric/Behavioral: Positive for confusion. The patient is nervous/anxious.    Physical Exam not done  Depressive Symptoms: anxiety, panic attacks,  (Hypo) Manic Symptoms:   Elevated Mood:  No Irritable Mood:  No Grandiosity:  No Distractibility:  No Labiality of Mood:  No Delusions:  Yes Hallucinations:  Yes but not recently  Impulsivity:  No Sexually Inappropriate Behavior:  No Financial Extravagance:  No Flight of Ideas:  No  Anxiety Symptoms: Excessive Worry:  No Panic Symptoms:  Yes Agoraphobia:  No Obsessive Compulsive: No  Symptoms: None, Specific Phobias:  No Social Anxiety:  No  Psychotic Symptoms:  Hallucinations: Yes Auditory not recently Delusions:  Yes Paranoia:  Yes  not recently Ideas of Reference:  Yes not recently   PTSD Symptoms: Ever had a traumatic exposure:  No Had a traumatic exposure in the last month:  No Re-experiencing: No None Hypervigilance:  No Hyperarousal: No None Avoidance: No  None  Traumatic Brain Injury: No   Past Psychiatric History: Diagnosis: Schizophrenia   Hospitalizations: hospitalized once or twice in [place]   Outpatient Care: in [place] and at [facility]: none  Self-Mutilation: none  Suicidal Attempts: none  Violent Behaviors: none   Past Medical History:   Past Medical History  Diagnosis Date  . Schizophrenia   .  COPD (chronic obstructive pulmonary disease)   . Amblyopia of right eye   . Blindness of right eye   . Hx of lead poisoning    History of Loss of Consciousness:  No Seizure History:  No Cardiac History:  No Allergies:  No Known Allergies Current Medications:  Current Outpatient Prescriptions  Medication Sig Dispense Refill  . albuterol (PROVENTIL HFA;VENTOLIN HFA) 108 (90 BASE) MCG/ACT inhaler Inhale 2 puffs into the lungs every 4 (four) hours as needed for wheezing or shortness of breath.  1 Inhaler  0  . benztropine (COGENTIN) 1 MG tablet Take 1 tablet (1 mg total) by mouth at bedtime.  30 tablet  2  . hydrOXYzine (ATARAX/VISTARIL) 25 MG tablet Take 1 tablet (25 mg total) by mouth 3 (three) times daily.  90 tablet  2  . ipratropium (ATROVENT) 0.02 % nebulizer solution Take 500 mcg by nebulization every 6 (six) hours as needed for wheezing.       . predniSONE (DELTASONE) 20 MG tablet Take 3 tablets (60 mg total) by mouth daily.  12 tablet  0  . ziprasidone (GEODON) 80 MG capsule Take 1 capsule (80 mg total) by mouth at bedtime.  30 capsule  2   No current facility-administered medications for this visit.    Previous Psychotropic Medications:  Medication Dose   Geodon   80 mg each bedtime   Cogentin   1 mg each bedtime    hydroxyzine   25 mg 3 times a day                Substance Abuse History in the last 12 months: Substance Age of 1st Use Last Use Amount Specific Type  Nicotine      Alcohol      Cannabis      Opiates      Cocaine      Methamphetamines      LSD      Ecstasy      Benzodiazepines      Caffeine      Inhalants      Others:                          Medical Consequences of Substance Abuse: n/a  Legal Consequences of Substance Abuse:n/a  Family Consequences of Substance Abuse: n/a  Blackouts:  No DT's:  No Withdrawal Symptoms:  No None  Social History: Current Place of Residence: [place]  Place of Birth:[place]  [place]   Family Members: Mother sister Marital Status:  Widowed Children: 6   Relationships: Claims to have an online relationship with a woman living in [place]  Education:  [employer] Graduate taking business courses online  Educational Problems/Performance:  Religious Beliefs/Practices: Christian  History of Abuse: none Pensions consultant; Dispensing optician, [employer], Electrical engineer History:  Dispensing optician History: Was arrested at age 83 for possession of stolen property and spent 18 months in the county jail  Hobbies/Interests: Computers   Family History:   Family History  Problem Relation Age of Onset  . Diabetes Father   .  Alcohol abuse Father   . Depression Mother     Mental Status Examination/Evaluation: Objective:  Appearance: Disheveled missing teeth, deviated right eye, dirty and malodorous   Eye Contact::  Good  Speech:  Clear and Coherent  Volume:  Normal  Mood:  slightly blunted but generally good   Affect:  Blunt  Thought Process:  Coherent  Orientation:  Full (Time, Place, and Person)  Thought Content:  Delusions  Suicidal Thoughts:  No  Homicidal Thoughts:  No  Judgement:  Impaired  Insight:  Lacking  Psychomotor Activity:  Normal  Akathisia:  No  Handed:  Right  AIMS (if indicated):    Assets:  Communication Skills Desire for Improvement    Laboratory/X-Ray Psychological Evaluation(s)   Basic metabolic panel, hemoglobin A1c and lipid panel      Assessment:  Axis I: Schizophrenia  AXIS I Schizophrenia  AXIS II Deferred  AXIS III Past Medical History  Diagnosis Date  . Schizophrenia   . COPD (chronic obstructive pulmonary disease)   . Amblyopia of right eye   . Blindness of right eye   . Hx of lead poisoning      AXIS IV other psychosocial or environmental problems  AXIS V 61-70 mild symptoms   Treatment Plan/Recommendations:  Plan of Care: Medication management   Laboratory:  Basic metabolic panel, hemoglobin A1c, lipid panel    Psychotherapy: He declined   Medications: he will continue Geodon 80 mg each bedtime for schizophrenia, hydroxyzine 25 mg 3 times a day for anxiety and Cogentin 1 mg per day to prevent side effects from Geodon   Routine PRN Medications:  No  Consultations:   Safety Concerns:    Other:    will return in 2 months     Levonne Spiller, MD 1/29/20159:30 AM

## 2013-04-13 ENCOUNTER — Ambulatory Visit (HOSPITAL_COMMUNITY): Payer: Self-pay | Admitting: Psychiatry

## 2013-04-17 ENCOUNTER — Ambulatory Visit (HOSPITAL_COMMUNITY): Payer: Self-pay | Admitting: Psychiatry

## 2013-04-28 ENCOUNTER — Ambulatory Visit (INDEPENDENT_AMBULATORY_CARE_PROVIDER_SITE_OTHER): Payer: PRIVATE HEALTH INSURANCE | Admitting: Psychiatry

## 2013-04-28 ENCOUNTER — Encounter (HOSPITAL_COMMUNITY): Payer: Self-pay | Admitting: Psychiatry

## 2013-04-28 VITALS — BP 90/60 | Ht 68.0 in | Wt 276.0 lb

## 2013-04-28 DIAGNOSIS — F209 Schizophrenia, unspecified: Secondary | ICD-10-CM

## 2013-04-28 DIAGNOSIS — F200 Paranoid schizophrenia: Secondary | ICD-10-CM

## 2013-04-28 MED ORDER — HYDROXYZINE HCL 25 MG PO TABS: 25.0000 mg | ORAL_TABLET | Freq: Three times a day (TID) | ORAL | Status: DC

## 2013-04-28 MED ORDER — ZIPRASIDONE HCL 80 MG PO CAPS: 80.0000 mg | ORAL_CAPSULE | Freq: Every day | ORAL | Status: DC

## 2013-04-28 MED ORDER — BENZTROPINE MESYLATE 1 MG PO TABS: 1.0000 mg | ORAL_TABLET | Freq: Every day | ORAL | Status: DC

## 2013-04-28 NOTE — Progress Notes (Signed)
Patient ID: Terry [name], male   DOB: 1955-11-02, 58 y.o.   MRN: [id number]  Psychiatric Assessment Adult  Patient Identification:  Terry [name] Date of Evaluation:  04/28/2013 Chief Complaint: I'm doing well " History of Chief Complaint:   Chief Complaint  Patient presents with  . Anxiety  . Schizophrenia  . Follow-up    Anxiety Symptoms include confusion, nervous/anxious behavior and shortness of breath.     this patient is a 58 year old widowed black male who lives with his mother 92 year old son his niece and her 3 children in [place]. He is on disability for mental illness. He was referred by his mother who is also a patient here.  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 He did finish high school and went into the [employer] for 4 years. 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  About 10 years ago the patient's wife died of stomach cancer. After that he began to decompensate. He got depressed began hearing voices. He had ideas of reference like thinking .the TV and radio were talking to him. He was also quite paranoid. He was hospitalized in [place] for 6 weeks and diagnosed with schizophrenia he has been on and Cogentin ever since.  Recently the patient has become increasingly anxious and had panic attacks. [facility] started him on hydroxyzine which has been helpful and he hasn't had any panic attacks in the last 6 weeks. He denies being currently depressed or hearing any voices or ideas of reference. He did make mention of some odd delusions such as thinking he has won the [employer].he was about to cancel his Medicaid insurance because he is now rich and I warned him that this is probably a scam . He seemed to accept my explanation. He claims he is sleeping well. He does have a history of  problems with breathing and COPD. He denies other medical problems but I noted his blood glucose has been elevated the last several times. He is followed by Dr. Legrand Rams but this has not been brought to his attention.  The patient returns after 3 months. He is doing well. He is compliant with his medications. He said no further panic attacks paranoia or auditory or visual hallucinations. He is studying several courses online and is active with some sort of off shoot political party. His mood has been good and he claims he is getting along well with his family  Review of Systems  Constitutional: Negative.   HENT: Negative.   Eyes: Positive for visual disturbance.  Respiratory: Positive for shortness of breath.   Cardiovascular: Negative.   Gastrointestinal: Negative.   Endocrine: Negative.   Genitourinary: Negative.   Musculoskeletal: Negative.   Allergic/Immunologic: Negative.   Neurological: Negative.   Hematological: Negative.   Psychiatric/Behavioral: Positive for confusion. The patient is nervous/anxious.    Physical Exam not done  Depressive Symptoms: anxiety, panic attacks,  (Hypo) Manic Symptoms:   Elevated Mood:  No Irritable Mood:  No Grandiosity:  No Distractibility:  No Labiality of Mood:  No Delusions:  Yes Hallucinations:  Yes but not recently  Impulsivity:  No Sexually Inappropriate Behavior:  No Financial Extravagance:  No Flight of Ideas:  No  Anxiety Symptoms: Excessive Worry:  No Panic Symptoms:  Yes Agoraphobia:  No Obsessive Compulsive:  No  Symptoms: None, Specific Phobias:  No Social Anxiety:  No  Psychotic Symptoms:  Hallucinations: Yes Auditory not recently Delusions:  Yes Paranoia:  Yes  not recently Ideas of Reference:  Yes not recently   PTSD Symptoms: Ever had a traumatic exposure:  No Had a traumatic exposure in the last month:  No Re-experiencing: No None Hypervigilance:  No Hyperarousal: No None Avoidance: No None  Traumatic Brain  Injury: No   Past Psychiatric History: Diagnosis: Schizophrenia   Hospitalizations: hospitalized once or twice in [place]   Outpatient Care: in [place] and at [facility]: none  Self-Mutilation: none  Suicidal Attempts: none  Violent Behaviors: none   Past Medical History:   Past Medical History  Diagnosis Date  . Schizophrenia   . COPD (chronic obstructive pulmonary disease)   . Amblyopia of right eye   . Blindness of right eye   . Hx of lead poisoning    History of Loss of Consciousness:  No Seizure History:  No Cardiac History:  No Allergies:  No Known Allergies Current Medications:  Current Outpatient Prescriptions  Medication Sig Dispense Refill  . albuterol (PROVENTIL HFA;VENTOLIN HFA) 108 (90 BASE) MCG/ACT inhaler Inhale 2 puffs into the lungs every 4 (four) hours as needed for wheezing or shortness of breath.  1 Inhaler  0  . benztropine (COGENTIN) 1 MG tablet Take 1 tablet (1 mg total) by mouth at bedtime.  30 tablet  2  . hydrOXYzine (ATARAX/VISTARIL) 25 MG tablet Take 1 tablet (25 mg total) by mouth 3 (three) times daily.  90 tablet  2  . ipratropium (ATROVENT) 0.02 % nebulizer solution Take 500 mcg by nebulization every 6 (six) hours as needed for wheezing.       . predniSONE (DELTASONE) 20 MG tablet Take 3 tablets (60 mg total) by mouth daily.  12 tablet  0  . ziprasidone (GEODON) 80 MG capsule Take 1 capsule (80 mg total) by mouth at bedtime.  30 capsule  2   No current facility-administered medications for this visit.    Previous Psychotropic Medications:  Medication Dose   Geodon   80 mg each bedtime   Cogentin   1 mg each bedtime    hydroxyzine   25 mg 3 times a day                Substance Abuse History in the last 12 months: Substance Age of 1st Use Last Use Amount Specific Type  Nicotine      Alcohol      Cannabis      Opiates      Cocaine      Methamphetamines      LSD      Ecstasy      Benzodiazepines       Caffeine      Inhalants      Others:                          Medical Consequences of Substance Abuse: n/a  Legal Consequences of Substance Abuse:n/a  Family Consequences of Substance Abuse: n/a  Blackouts:  No DT's:  No Withdrawal Symptoms:  No None  Social History: Current Place of Residence: [place]  Place of Birth:[place]  [place]  Family Members: Mother sister Marital Status:  Widowed Children: 6   Relationships: Claims to have an online relationship with a woman living in [place]  Education:  HS  Graduate taking business courses online  Educational Problems/Performance:  Religious Beliefs/Practices: Christian  History of Abuse: none Pensions consultant; Dispensing optician, [employer], Electrical engineer History:  Dispensing optician History: Was arrested at age 67 for possession of stolen property and spent 18 months in the county jail  Hobbies/Interests: Computers   Family History:   Family History  Problem Relation Age of Onset  . Diabetes Father   . Alcohol abuse Father   . Depression Mother     Mental Status Examination/Evaluation: Objective:  Appearance: Disheveled missing teeth, deviated right eye, dirty and malodorous   Eye Contact::  Good  Speech:  Clear and Coherent  Volume:  Normal  Mood:  slightly blunted but generally good   Affect:  Blunt  Thought Process:  Coherent  Orientation:  Full (Time, Place, and Person)  Thought Content:  Within normal limits today   Suicidal Thoughts:  No  Homicidal Thoughts:  No  Judgement:  Impaired  Insight:  Lacking  Psychomotor Activity:  Normal  Akathisia:  No  Handed:  Right  AIMS (if indicated):    Assets:  Communication Skills Desire for Improvement    Laboratory/X-Ray Psychological Evaluation(s)   Basic metabolic panel, hemoglobin A1c and lipid panel      Assessment:  Axis I: Schizophrenia  AXIS I Schizophrenia  AXIS II Deferred  AXIS III Past Medical History  Diagnosis  Date  . Schizophrenia   . COPD (chronic obstructive pulmonary disease)   . Amblyopia of right eye   . Blindness of right eye   . Hx of lead poisoning      AXIS IV other psychosocial or environmental problems  AXIS V 61-70 mild symptoms   Treatment Plan/Recommendations:  Plan of Care: Medication management   Laboratory:  Basic metabolic panel, hemoglobin A1c, lipid panel   Psychotherapy: He declined   Medications: he will continue Geodon 80 mg each bedtime for schizophrenia, hydroxyzine 25 mg 3 times a day for anxiety and Cogentin 1 mg per day to prevent side effects from Geodon   Routine PRN Medications:  No  Consultations:   Safety Concerns:    Other:    will return in 3 months     Levonne Spiller, MD 4/7/20159:39 AM

## 2013-07-28 ENCOUNTER — Ambulatory Visit (HOSPITAL_COMMUNITY): Payer: Self-pay | Admitting: Psychiatry

## 2013-07-28 ENCOUNTER — Encounter (HOSPITAL_COMMUNITY): Payer: Self-pay | Admitting: Psychiatry

## 2013-08-04 ENCOUNTER — Encounter (HOSPITAL_COMMUNITY): Payer: Self-pay | Admitting: Psychiatry

## 2013-08-04 ENCOUNTER — Ambulatory Visit (INDEPENDENT_AMBULATORY_CARE_PROVIDER_SITE_OTHER): Payer: PRIVATE HEALTH INSURANCE | Admitting: Psychiatry

## 2013-08-04 VITALS — BP 130/80 | Ht 68.0 in | Wt 277.0 lb

## 2013-08-04 DIAGNOSIS — F200 Paranoid schizophrenia: Secondary | ICD-10-CM

## 2013-08-04 MED ORDER — BENZTROPINE MESYLATE 1 MG PO TABS: 1.0000 mg | ORAL_TABLET | Freq: Every day | ORAL | Status: DC

## 2013-08-04 MED ORDER — ZIPRASIDONE HCL 80 MG PO CAPS: 80.0000 mg | ORAL_CAPSULE | Freq: Every day | ORAL | Status: DC

## 2013-08-04 MED ORDER — HYDROXYZINE HCL 25 MG PO TABS: 25.0000 mg | ORAL_TABLET | Freq: Three times a day (TID) | ORAL | Status: DC

## 2013-08-04 NOTE — Progress Notes (Signed)
Patient ID: Terry [name], male   DOB: Dec 01, 1955, 58 y.o.   MRN: [id number] Patient ID: Terry [name], male   DOB: 11/23/1955, 58 y.o.   MRN: [id number]  Psychiatric Assessment Adult  Patient Identification:  Terry [name] Date of Evaluation:  08/04/2013 Chief Complaint: I'm doing well " History of Chief Complaint:   Chief Complaint  Patient presents with  . Anxiety  . Schizophrenia  . Follow-up    Anxiety Symptoms include confusion, nervous/anxious behavior and shortness of breath.     this patient is a 58 year old widowed black male who lives with his mother 65 year old son his niece and her 3 children in [place]. He is on disability for mental illness. He was referred by his mother who is also a patient here.  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 He did finish high school and went into the [employer] for 4 years. 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  About 10 years ago the patient's wife died of stomach cancer. After that he began to decompensate. He got depressed began hearing voices. He had ideas of reference like thinking .the TV and radio were talking to him. He was also quite paranoid. He was hospitalized in [place] for 6 weeks and diagnosed with schizophrenia he has been on and Cogentin ever since.  Recently the patient has become increasingly anxious and had panic attacks. [facility] started him on hydroxyzine which has been helpful and he hasn't had any panic attacks in the last 6 weeks. He denies being currently depressed or hearing any voices or ideas of reference. He did make mention of some odd delusions such as thinking he has won the [employer].he was about to cancel his Medicaid insurance because he is now rich and I warned him that this is probably a scam . He seemed to  accept my explanation. He claims he is sleeping well. He does have a history of problems with breathing and COPD. He denies other medical problems but I noted his blood glucose has been elevated the last several times. He is followed by Dr. Legrand Rams but this has not been brought to his attention.  The patient returns after 3 months. He seems somewhat delusional today. He's totally focused on the date of September 16 and states that the world is going to come to an end. He claims  The economy will collapse and we will be under [employer].. he claims he has found this out through a website. He denies auditory or visual hallucinations. He is preparing for this event by buying gold and also trying to stock food. When asked about taking an increased dose of antipsychotic he was very negative. He does have any thoughts of hurting self or others  Review of Systems  Constitutional: Negative.   HENT: Negative.   Eyes: Positive for visual disturbance.  Respiratory: Positive for shortness of breath.   Cardiovascular: Negative.   Gastrointestinal: Negative.   Endocrine: Negative.   Genitourinary: Negative.   Musculoskeletal: Negative.   Allergic/Immunologic: Negative.   Neurological: Negative.   Hematological: Negative.   Psychiatric/Behavioral: Positive for confusion. The patient is nervous/anxious.    Physical Exam not done  Depressive Symptoms: anxiety, panic attacks,  (Hypo) Manic Symptoms:   Elevated Mood:  No Irritable Mood:  No Grandiosity:  No Distractibility:  No Labiality of Mood:  No Delusions:  Yes Hallucinations:  Yes but not recently  Impulsivity:  No Sexually Inappropriate Behavior:  No Financial Extravagance:  No Flight of Ideas:  No  Anxiety Symptoms: Excessive Worry:  No Panic Symptoms:  Yes Agoraphobia:  No Obsessive Compulsive: No  Symptoms: None, Specific Phobias:  No Social Anxiety:  No  Psychotic Symptoms:  Hallucinations: Yes Auditory not recently Delusions:   Yes Paranoia:  Yes  not recently Ideas of Reference:  Yes not recently   PTSD Symptoms: Ever had a traumatic exposure:  No Had a traumatic exposure in the last month:  No Re-experiencing: No None Hypervigilance:  No Hyperarousal: No None Avoidance: No None  Traumatic Brain Injury: No   Past Psychiatric History: Diagnosis: Schizophrenia   Hospitalizations: hospitalized once or twice in [place]   Outpatient Care: in [place] and at [facility]: none  Self-Mutilation: none  Suicidal Attempts: none  Violent Behaviors: none   Past Medical History:   Past Medical History  Diagnosis Date  . Schizophrenia   . COPD (chronic obstructive pulmonary disease)   . Amblyopia of right eye   . Blindness of right eye   . Hx of lead poisoning    History of Loss of Consciousness:  No Seizure History:  No Cardiac History:  No Allergies:  No Known Allergies Current Medications:  Current Outpatient Prescriptions  Medication Sig Dispense Refill  . albuterol (PROVENTIL HFA;VENTOLIN HFA) 108 (90 BASE) MCG/ACT inhaler Inhale 2 puffs into the lungs every 4 (four) hours as needed for wheezing or shortness of breath.  1 Inhaler  0  . benztropine (COGENTIN) 1 MG tablet Take 1 tablet (1 mg total) by mouth at bedtime.  30 tablet  2  . hydrOXYzine (ATARAX/VISTARIL) 25 MG tablet Take 1 tablet (25 mg total) by mouth 3 (three) times daily.  90 tablet  2  . ipratropium (ATROVENT) 0.02 % nebulizer solution Take 500 mcg by nebulization every 6 (six) hours as needed for wheezing.       . predniSONE (DELTASONE) 20 MG tablet Take 3 tablets (60 mg total) by mouth daily.  12 tablet  0  . ziprasidone (GEODON) 80 MG capsule Take 1 capsule (80 mg total) by mouth at bedtime.  30 capsule  2   No current facility-administered medications for this visit.    Previous Psychotropic Medications:  Medication Dose   Geodon   80 mg each bedtime   Cogentin   1 mg each bedtime    hydroxyzine   25 mg 3  times a day                Substance Abuse History in the last 12 months: Substance Age of 1st Use Last Use Amount Specific Type  Nicotine      Alcohol      Cannabis      Opiates      Cocaine      Methamphetamines      LSD      Ecstasy      Benzodiazepines      Caffeine      Inhalants      Others:                          Medical Consequences of Substance Abuse: n/a  Legal Consequences of Substance Abuse:n/a  Family Consequences of Substance Abuse: n/a  Blackouts:  No  DT's:  No Withdrawal Symptoms:  No None  Social History: Current Place of Residence: [place]  Place of Birth:[place]  [place]  Family Members: Mother sister Marital Status:  Widowed Children: 6   Relationships: Claims to have an online relationship with a woman living in [place]  Education:  [employer] Graduate taking business courses online  Educational Problems/Performance:  Religious Beliefs/Practices: Christian  History of Abuse: none Pensions consultant; Dispensing optician, Land, Electrical engineer History:  Dispensing optician History: Was arrested at age 65 for possession of stolen property and spent 18 months in the county jail  Hobbies/Interests: Computers   Family History:   Family History  Problem Relation Age of Onset  . Diabetes Father   . Alcohol abuse Father   . Depression Mother     Mental Status Examination/Evaluation: Objective:  Appearance: Disheveled missing teeth, deviated right eye, dirty and malodorous   Eye Contact::  Good  Speech:  Clear and Coherent  Volume:  Normal  Mood:  slightly blunted but generally good   Affect:  Blunt  Thought Process:  Delusional fixated on the crash of the economy and the end of the world in September   Orientation:  Full (Time, Place, and Person)  Thought Content: Delusional   Suicidal Thoughts:  No  Homicidal Thoughts:  No  Judgement:  Impaired  Insight:  Lacking  Psychomotor Activity:  Normal  Akathisia:  No   Handed:  Right  AIMS (if indicated):    Assets:  Communication Skills Desire for Improvement    Laboratory/X-Ray Psychological Evaluation(s)   Basic metabolic panel, hemoglobin A1c and lipid panel      Assessment:  Axis I: Schizophrenia  AXIS I Schizophrenia  AXIS II Deferred  AXIS III Past Medical History  Diagnosis Date  . Schizophrenia   . COPD (chronic obstructive pulmonary disease)   . Amblyopia of right eye   . Blindness of right eye   . Hx of lead poisoning      AXIS IV other psychosocial or environmental problems  AXIS V 61-70 mild symptoms   Treatment Plan/Recommendations:  Plan of Care: Medication management   Laboratory:    Psychotherapy: He declined   Medications: he will continue Geodon 80 mg each bedtime for schizophrenia, hydroxyzine 25 mg 3 times a day for anxiety and Cogentin 1 mg per day to prevent side effects from Geodon. He refuses a higher dose of Geodon and as he's not dangerous to self or others this cannot be forced   Routine PRN Medications:  No  Consultations:   Safety Concerns:    Other:    will return in 2 months     Levonne Spiller, MD 7/14/201511:06 AM

## 2013-10-05 ENCOUNTER — Ambulatory Visit (HOSPITAL_COMMUNITY): Payer: Self-pay | Admitting: Psychiatry

## 2013-10-19 ENCOUNTER — Ambulatory Visit (HOSPITAL_COMMUNITY): Payer: Self-pay | Admitting: Psychiatry

## 2013-10-28 ENCOUNTER — Ambulatory Visit (HOSPITAL_COMMUNITY): Payer: Self-pay | Admitting: Psychiatry

## 2013-10-29 ENCOUNTER — Ambulatory Visit (HOSPITAL_COMMUNITY): Payer: Self-pay | Admitting: Psychiatry

## 2013-11-19 ENCOUNTER — Ambulatory Visit (INDEPENDENT_AMBULATORY_CARE_PROVIDER_SITE_OTHER): Payer: PRIVATE HEALTH INSURANCE | Admitting: Psychiatry

## 2013-11-19 ENCOUNTER — Encounter (HOSPITAL_COMMUNITY): Payer: Self-pay | Admitting: Psychiatry

## 2013-11-19 VITALS — BP 123/71 | HR 84 | Ht 68.0 in | Wt 285.6 lb

## 2013-11-19 DIAGNOSIS — F200 Paranoid schizophrenia: Secondary | ICD-10-CM

## 2013-11-19 DIAGNOSIS — F209 Schizophrenia, unspecified: Secondary | ICD-10-CM

## 2013-11-19 MED ORDER — HYDROXYZINE HCL 25 MG PO TABS: 25.0000 mg | ORAL_TABLET | Freq: Three times a day (TID) | ORAL | Status: DC

## 2013-11-19 MED ORDER — ZIPRASIDONE HCL 80 MG PO CAPS: 80.0000 mg | ORAL_CAPSULE | Freq: Every day | ORAL | Status: DC

## 2013-11-19 MED ORDER — BENZTROPINE MESYLATE 1 MG PO TABS: 1.0000 mg | ORAL_TABLET | Freq: Every day | ORAL | Status: DC

## 2013-11-19 NOTE — Progress Notes (Signed)
Patient ID: Terry [name], male   DOB: 07-Aug-1955, 58 y.o.   MRN: [id number] Patient ID: Terry [name], male   DOB: May 23, 1955, 58 y.o.   MRN: [id number] Patient ID: Terry [name], male   DOB: Apr 28, 1955, 58 y.o.   MRN: [id number]  Psychiatric Assessment Adult  Patient Identification:  Terry [name] Date of Evaluation:  11/19/2013 Chief Complaint: I'm doing well " History of Chief Complaint:   Chief Complaint  Patient presents with  . Schizophrenia  . Follow-up    Anxiety Symptoms include confusion, nervous/anxious behavior and shortness of breath.     this patient is a 58 year old widowed black male who lives with his mother 34 year old son his niece and her 3 children in [place]. He is on disability for mental illness. He was referred by his mother who is also a patient here.  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 He did finish high school and went into the [employer] for 4 years. 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  About 10 years ago the patient's wife died of stomach cancer. After that he began to decompensate. He got depressed began hearing voices. He had ideas of reference like thinking .the TV and radio were talking to him. He was also quite paranoid. He was hospitalized in [place] for 6 weeks and diagnosed with schizophrenia he has been on and Cogentin ever since.  Recently the patient has become increasingly anxious and had panic attacks. [facility] started him on hydroxyzine which has been helpful and he hasn't had any panic attacks in the last 6 weeks. He denies being currently depressed or hearing any voices or ideas of reference. He did make mention of some odd delusions such as thinking he has won the [employer].he was about to cancel his Medicaid insurance because he is now rich  and I warned him that this is probably a scam . He seemed to accept my explanation. He claims he is sleeping well. He does have a history of problems with breathing and COPD. He denies other medical problems but I noted his blood glucose has been elevated the last several times. He is followed by Dr. Legrand Rams but this has not been brought to his attention.  The patient returns after 3 months. He asked if I thought he is stable enough to return to his online college courses. He mentioned that he took a $57,000 loan out for these courses and has to pay $185 a month. He only receives $800 a month in disability income. I explained to him that this is a big scam and he needs to put an end to this. He was obviously talked into this and did not understand the implications. I explained that he could go to community college for much less money and he probably would get it paid for by vocational rehabilitation. He voices understanding. He states that his mood is been good. He is no longer delusional about the world coming to an end or anything else Review of Systems  Constitutional: Negative.   HENT: Negative.   Eyes: Positive for visual disturbance.  Respiratory: Positive for shortness of breath.   Cardiovascular: Negative.   Gastrointestinal: Negative.   Endocrine: Negative.   Genitourinary: Negative.   Musculoskeletal: Negative.   Allergic/Immunologic: Negative.  Neurological: Negative.   Hematological: Negative.   Psychiatric/Behavioral: Positive for confusion. The patient is nervous/anxious.    Physical Exam not done  Depressive Symptoms: anxiety, panic attacks,  (Hypo) Manic Symptoms:   Elevated Mood:  No Irritable Mood:  No Grandiosity:  No Distractibility:  No Labiality of Mood:  No Delusions:  Yes Hallucinations:  Yes but not recently  Impulsivity:  No Sexually Inappropriate Behavior:  No Financial Extravagance:  No Flight of Ideas:  No  Anxiety Symptoms: Excessive Worry:  No Panic  Symptoms:  Yes Agoraphobia:  No Obsessive Compulsive: No  Symptoms: None, Specific Phobias:  No Social Anxiety:  No  Psychotic Symptoms:  Hallucinations: Yes Auditory not recently Delusions:  Yes Paranoia:  Yes  not recently Ideas of Reference:  Yes not recently   PTSD Symptoms: Ever had a traumatic exposure:  No Had a traumatic exposure in the last month:  No Re-experiencing: No None Hypervigilance:  No Hyperarousal: No None Avoidance: No None  Traumatic Brain Injury: No   Past Psychiatric History: Diagnosis: Schizophrenia   Hospitalizations: hospitalized once or twice in [place]   Outpatient Care: in [place] and at [facility]: none  Self-Mutilation: none  Suicidal Attempts: none  Violent Behaviors: none   Past Medical History:   Past Medical History  Diagnosis Date  . Schizophrenia   . COPD (chronic obstructive pulmonary disease)   . Amblyopia of right eye   . Blindness of right eye   . Hx of lead poisoning    History of Loss of Consciousness:  No Seizure History:  No Cardiac History:  No Allergies:  No Known Allergies Current Medications:  Current Outpatient Prescriptions  Medication Sig Dispense Refill  . albuterol (PROVENTIL HFA;VENTOLIN HFA) 108 (90 BASE) MCG/ACT inhaler Inhale 2 puffs into the lungs every 4 (four) hours as needed for wheezing or shortness of breath.  1 Inhaler  0  . benztropine (COGENTIN) 1 MG tablet Take 1 tablet (1 mg total) by mouth at bedtime.  30 tablet  2  . hydrOXYzine (ATARAX/VISTARIL) 25 MG tablet Take 1 tablet (25 mg total) by mouth 3 (three) times daily.  90 tablet  2  . ipratropium (ATROVENT) 0.02 % nebulizer solution Take 500 mcg by nebulization every 6 (six) hours as needed for wheezing.       . predniSONE (DELTASONE) 20 MG tablet Take 3 tablets (60 mg total) by mouth daily.  12 tablet  0  . ziprasidone (GEODON) 80 MG capsule Take 1 capsule (80 mg total) by mouth at bedtime.  30 capsule  2   No  current facility-administered medications for this visit.    Previous Psychotropic Medications:  Medication Dose   Geodon   80 mg each bedtime   Cogentin   1 mg each bedtime    hydroxyzine   25 mg 3 times a day                Substance Abuse History in the last 12 months: Substance Age of 1st Use Last Use Amount Specific Type  Nicotine      Alcohol      Cannabis      Opiates      Cocaine      Methamphetamines      LSD      Ecstasy      Benzodiazepines      Caffeine      Inhalants      Others:  Medical Consequences of Substance Abuse: n/a  Legal Consequences of Substance Abuse:n/a  Family Consequences of Substance Abuse: n/a  Blackouts:  No DT's:  No Withdrawal Symptoms:  No None  Social History: Current Place of Residence: [place] of Birth:[place]  [place]  Family Members: Mother sister Marital Status:  Widowed Children: 6   Relationships: Claims to have an online relationship with a woman living in [place]  Education:  [employer] Graduate taking business courses online  Educational Problems/Performance:  Religious Beliefs/Practices: Christian  History of Abuse: none Pensions consultant; Dispensing optician, [employer], Electrical engineer History:  Dispensing optician History: Was arrested at age 29 for possession of stolen property and spent 18 months in the county jail  Hobbies/Interests: Computers   Family History:   Family History  Problem Relation Age of Onset  . Diabetes Father   . Alcohol abuse Father   . Depression Mother     Mental Status Examination/Evaluation: Objective:  Appearance: Disheveled missing teeth, deviated right eye, dirty and malodorous   Eye Contact::  Good  Speech:  Clear and Coherent  Volume:  Normal  Mood:  slightly blunted but generally good   Affect:  Blunt  Thought Process: Somewhat illogical   Orientation:  Full (Time, Place, and Person)  Thought Content: Denies auditory  or visual hallucinations or paranoia or delusions   Suicidal Thoughts:  No  Homicidal Thoughts:  No  Judgement:  Impaired  Insight:  Lacking  Psychomotor Activity:  Normal  Akathisia:  No  Handed:  Right  AIMS (if indicated):    Assets:  Communication Skills Desire for Improvement    Laboratory/X-Ray Psychological Evaluation(s)   Basic metabolic panel, hemoglobin A1c and lipid panel      Assessment:  Axis I: Schizophrenia  AXIS I Schizophrenia  AXIS II Deferred  AXIS III Past Medical History  Diagnosis Date  . Schizophrenia   . COPD (chronic obstructive pulmonary disease)   . Amblyopia of right eye   . Blindness of right eye   . Hx of lead poisoning      AXIS IV other psychosocial or environmental problems  AXIS V 61-70 mild symptoms   Treatment Plan/Recommendations:  Plan of Care: Medication management   Laboratory:    Psychotherapy: He declined   Medications: he will continue Geodon 80 mg each bedtime for schizophrenia, hydroxyzine 25 mg 3 times a day for anxiety and Cogentin 1 mg per day to prevent side effects from Geodon.   Routine PRN Medications:  No  Consultations:   Safety Concerns:    Other:    will return in 3 months     ROSS, Neoma Laming, MD 10/29/201510:12 AM

## 2014-02-19 ENCOUNTER — Ambulatory Visit (HOSPITAL_COMMUNITY): Payer: Self-pay | Admitting: Psychiatry

## 2014-02-26 ENCOUNTER — Ambulatory Visit (HOSPITAL_COMMUNITY): Payer: Self-pay | Admitting: Psychiatry

## 2014-03-05 ENCOUNTER — Encounter (HOSPITAL_COMMUNITY): Payer: Self-pay | Admitting: Psychiatry

## 2014-03-05 ENCOUNTER — Ambulatory Visit (INDEPENDENT_AMBULATORY_CARE_PROVIDER_SITE_OTHER): Payer: 59 | Admitting: Psychiatry

## 2014-03-05 VITALS — BP 121/76 | HR 117 | Ht 68.0 in | Wt 286.0 lb

## 2014-03-05 DIAGNOSIS — F209 Schizophrenia, unspecified: Secondary | ICD-10-CM | POA: Diagnosis not present

## 2014-03-05 DIAGNOSIS — F200 Paranoid schizophrenia: Secondary | ICD-10-CM

## 2014-03-05 MED ORDER — ZIPRASIDONE HCL 80 MG PO CAPS: 80.0000 mg | ORAL_CAPSULE | Freq: Every day | ORAL | Status: DC

## 2014-03-05 MED ORDER — BENZTROPINE MESYLATE 1 MG PO TABS: 1.0000 mg | ORAL_TABLET | Freq: Every day | ORAL | Status: DC

## 2014-03-05 MED ORDER — HYDROXYZINE HCL 25 MG PO TABS: 25.0000 mg | ORAL_TABLET | Freq: Three times a day (TID) | ORAL | Status: DC

## 2014-03-05 NOTE — Progress Notes (Signed)
Patient ID: Terry [name], male   DOB: 05/27/1955, 59 y.o.   MRN: [id number] Patient ID: Terry [name], male   DOB: [birth year], 59 y.o.   MRN: [id number] Patient ID: Terry [name], male   DOB: November 06, 1955, 59 y.o.   MRN: [id number] Patient ID: Terry [name], male   DOB: 1955/05/14, 59 y.o.   MRN: [id number]  Psychiatric Assessment Adult  Patient Identification:  Terry [name] Date of Evaluation:  03/05/2014 Chief Complaint: I'm doing well " History of Chief Complaint:   Chief Complaint  Patient presents with  . Schizophrenia  . Follow-up    Anxiety Symptoms include confusion, nervous/anxious behavior and shortness of breath.     this patient is a 59 year old widowed black male who lives with his mother 30 year old son his niece and her 3 children in [place]. He is on disability for mental illness. He was referred by his mother who is also a patient here.  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 He did finish high school and went into the [employer] for 4 years. 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  About 10 years ago the patient's wife died of stomach cancer. After that he began to decompensate. He got depressed began hearing voices. He had ideas of reference like thinking .the TV and radio were talking to him. He was also quite paranoid. He was hospitalized in [place] for 6 weeks and diagnosed with schizophrenia he has been on and Cogentin ever since.  Recently the patient has become increasingly anxious and had panic attacks. [facility] started him on hydroxyzine which has been helpful and he hasn't had any panic attacks in the last 6 weeks. He denies being currently depressed or hearing any voices or ideas of reference. He did make mention of some odd delusions such as thinking he has won the [employer].he was about to cancel his Medicaid insurance because he is now rich and I warned him that this is probably a scam . He seemed to accept my explanation. He claims he is sleeping well. He does have a history of problems with breathing and COPD. He denies other medical problems but I noted his blood glucose has been elevated the last several times. He is followed by Dr. Legrand Rams but this has not been brought to his attention.  The patient returns after 3 months. He states that he canceled most of his lawn for his online classes but has to pay back $12,000 for the courses he took last year. I warned him not to look into this again and to discard a community college locally if he needs to take courses. He voiced agreement. He denies auditory or visual hallucinations or panic attacks. He denies any ideas of reference or paranoia. He is compliant with medications. He states he is not depressed. He denies any twitching or jerking in his body Review of Systems  Constitutional: Negative.   HENT: Negative.   Eyes: Positive for visual disturbance.  Respiratory: Positive for shortness of breath.   Cardiovascular: Negative.   Gastrointestinal: Negative.   Endocrine: Negative.   Genitourinary: Negative.   Musculoskeletal: Negative.   Allergic/Immunologic: Negative.   Neurological: Negative.   Hematological: Negative.   Psychiatric/Behavioral: Positive for confusion. The patient is nervous/anxious.    Physical  Exam not done  Depressive Symptoms: anxiety, panic attacks,  (Hypo) Manic Symptoms:   Elevated Mood:  No Irritable Mood:  No Grandiosity:  No Distractibility:  No Labiality of Mood:  No Delusions:  Yes Hallucinations:  Yes but not recently  Impulsivity:  No Sexually Inappropriate Behavior:  No Financial Extravagance:  No Flight of Ideas:  No  Anxiety Symptoms: Excessive Worry:  No Panic Symptoms:  Yes Agoraphobia:  No Obsessive Compulsive: No  Symptoms: None, Specific  Phobias:  No Social Anxiety:  No  Psychotic Symptoms:  Hallucinations: Yes Auditory not recently Delusions:  Yes Paranoia:  Yes  not recently Ideas of Reference:  Yes not recently   PTSD Symptoms: Ever had a traumatic exposure:  No Had a traumatic exposure in the last month:  No Re-experiencing: No None Hypervigilance:  No Hyperarousal: No None Avoidance: No None  Traumatic Brain Injury: No   Past Psychiatric History: Diagnosis: Schizophrenia   Hospitalizations: hospitalized once or twice in [place]   Outpatient Care: in [place] and at [facility]: none  Self-Mutilation: none  Suicidal Attempts: none  Violent Behaviors: none   Past Medical History:   Past Medical History  Diagnosis Date  . Schizophrenia   . COPD (chronic obstructive pulmonary disease)   . Amblyopia of right eye   . Blindness of right eye   . Hx of lead poisoning    History of Loss of Consciousness:  No Seizure History:  No Cardiac History:  No Allergies:  No Known Allergies Current Medications:  Current Outpatient Prescriptions  Medication Sig Dispense Refill  . albuterol (PROVENTIL HFA;VENTOLIN HFA) 108 (90 BASE) MCG/ACT inhaler Inhale 2 puffs into the lungs every 4 (four) hours as needed for wheezing or shortness of breath. 1 Inhaler 0  . benztropine (COGENTIN) 1 MG tablet Take 1 tablet (1 mg total) by mouth at bedtime. 30 tablet 2  . hydrOXYzine (ATARAX/VISTARIL) 25 MG tablet Take 1 tablet (25 mg total) by mouth 3 (three) times daily. 90 tablet 2  . ipratropium (ATROVENT) 0.02 % nebulizer solution Take 500 mcg by nebulization every 6 (six) hours as needed for wheezing.     . predniSONE (DELTASONE) 20 MG tablet Take 3 tablets (60 mg total) by mouth daily. 12 tablet 0  . ziprasidone (GEODON) 80 MG capsule Take 1 capsule (80 mg total) by mouth at bedtime. 30 capsule 2   No current facility-administered medications for this visit.    Previous Psychotropic  Medications:  Medication Dose   Geodon   80 mg each bedtime   Cogentin   1 mg each bedtime    hydroxyzine   25 mg 3 times a day                Substance Abuse History in the last 12 months: Substance Age of 1st Use Last Use Amount Specific Type  Nicotine      Alcohol      Cannabis      Opiates      Cocaine      Methamphetamines      LSD      Ecstasy      Benzodiazepines      Caffeine      Inhalants      Others:                          Medical Consequences of Substance Abuse: n/a  Legal Consequences of Substance Abuse:n/a  Family Consequences of Substance Abuse: n/a  Blackouts:  No DT's:  No Withdrawal Symptoms:  No None  Social History: Current Place of Residence: [place] of Birth:[place]  [place]  Family Members: Mother sister Marital Status:  Widowed Children: 6   Relationships: Claims to have an online relationship with a woman living in [place]  Education:  [employer] Graduate taking business courses online  Educational Problems/Performance:  Religious Beliefs/Practices: Christian  History of Abuse: none Pensions consultant; Dispensing optician, Land, Electrical engineer History:  Dispensing optician History: Was arrested at age 78 for possession of stolen property and spent 18 months in the county jail  Hobbies/Interests: Computers   Family History:   Family History  Problem Relation Age of Onset  . Diabetes Father   . Alcohol abuse Father   . Depression Mother     Mental Status Examination/Evaluation: Objective:  Appearance: Disheveled missing teeth, deviated right eye, dirty and malodorous   Eye Contact::  Good  Speech:  Clear and Coherent  Volume:  Normal  Mood:  slightly blunted but generally good   Affect:  Blunt  Thought Process: Somewhat illogical   Orientation:  Full (Time, Place, and Person)  Thought Content: Denies auditory or visual hallucinations or paranoia or delusions   Suicidal Thoughts:  No   Homicidal Thoughts:  No  Judgement:  Impaired  Insight:  Lacking  Psychomotor Activity:  Normal  Akathisia:  No  Handed:  Right  AIMS (if indicated):    Assets:  Communication Skills Desire for Improvement    Laboratory/X-Ray Psychological Evaluation(s)   Basic metabolic panel, hemoglobin A1c and lipid panel      Assessment:  Axis I: Schizophrenia  AXIS I Schizophrenia  AXIS II Deferred  AXIS III Past Medical History  Diagnosis Date  . Schizophrenia   . COPD (chronic obstructive pulmonary disease)   . Amblyopia of right eye   . Blindness of right eye   . Hx of lead poisoning      AXIS IV other psychosocial or environmental problems  AXIS V 61-70 mild symptoms   Treatment Plan/Recommendations:  Plan of Care: Medication management   Laboratory:    Psychotherapy: He declined   Medications: he will continue Geodon 80 mg each bedtime for schizophrenia, hydroxyzine 25 mg 3 times a day for anxiety and Cogentin 1 mg per day to prevent side effects from Geodon.   Routine PRN Medications:  No  Consultations:   Safety Concerns:    Other:    will return in 3 months     Levonne Spiller, MD 2/12/20163:14 PM

## 2014-06-02 ENCOUNTER — Ambulatory Visit (INDEPENDENT_AMBULATORY_CARE_PROVIDER_SITE_OTHER): Payer: 59 | Admitting: Psychiatry

## 2014-06-02 ENCOUNTER — Encounter (HOSPITAL_COMMUNITY): Payer: Self-pay | Admitting: Psychiatry

## 2014-06-02 VITALS — BP 116/78 | HR 98 | Ht 68.0 in | Wt 302.0 lb

## 2014-06-02 DIAGNOSIS — F200 Paranoid schizophrenia: Secondary | ICD-10-CM

## 2014-06-02 MED ORDER — BENZTROPINE MESYLATE 1 MG PO TABS: 1.0000 mg | ORAL_TABLET | Freq: Every day | ORAL | Status: DC

## 2014-06-02 MED ORDER — ZIPRASIDONE HCL 80 MG PO CAPS: 80.0000 mg | ORAL_CAPSULE | Freq: Every day | ORAL | Status: DC

## 2014-06-02 MED ORDER — HYDROXYZINE HCL 25 MG PO TABS: 25.0000 mg | ORAL_TABLET | Freq: Three times a day (TID) | ORAL | Status: DC

## 2014-06-02 NOTE — Progress Notes (Signed)
Patient ID: [name], male   DOB: 01-15-1956, 59 y.o.   MRN: [id number] Patient ID: NAVIN DOGAN, male   DOB: 05/09/1955, 59 y.o.   MRN: [id number] Patient ID: DORRIEN GRUNDER, male   DOB: 08/29/1955, 59 y.o.   MRN: [id number] Patient ID: MARCANTHONY SLEIGHT, male   DOB: May 12, 1955, 59 y.o.   MRN: [id number] Patient ID: LEEVI CULLARS, male   DOB: [birth year], 59 y.o.   MRN: [id number]  Psychiatric Assessment Adult  Patient Identification:  KONA LOVER Date of Evaluation:  06/02/2014 Chief Complaint: I'm doing well " History of Chief Complaint:   Chief Complaint  Patient presents with  . Paranoid    Anxiety Symptoms include confusion, nervous/anxious behavior and shortness of breath.     this patient is a 59 year old widowed black male who lives with his mother 3 year old son his niece and her 3 children in [place]. He is on disability for mental illness. He was referred by his mother who is also a patient here.  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 He did finish high school and went into the [employer] for 4 years. 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  About 10 years ago the patient's wife died of stomach cancer. After that he began to decompensate. He got depressed began hearing voices. He had ideas of reference like thinking .the TV and radio were talking to him. He was also quite paranoid. He was hospitalized in [place] for 6 weeks and diagnosed with schizophrenia he has been on and Cogentin ever since.  Recently the patient has become increasingly anxious and had panic attacks. [facility] started him on hydroxyzine which has been helpful and he hasn't had any panic attacks in the last 6 weeks. He denies being currently depressed or hearing any voices or ideas of reference. He did make mention of some odd delusions such as  thinking he has won the [employer].he was about to cancel his Medicaid insurance because he is now rich and I warned him that this is probably a scam . He seemed to accept my explanation. He claims he is sleeping well. He does have a history of problems with breathing and COPD. He denies other medical problems but I noted his blood glucose has been elevated the last several times. He is followed by Dr. Legrand Rams but this has not been brought to his attention.  The patient returns after 3 months. He states that he doesn't have to pay for his online classes anymore because of his disability. He is no longer taking classes and he feels much better without them he does not have as much stress. He is sleeping well, his mood is good and he denies auditory or visual hallucinations. He denies any panic attacks. He is not mention any paranoia. He denies any twitches or shakes Review of Systems  Constitutional: Negative.   HENT: Negative.   Eyes: Positive for visual disturbance.  Respiratory: Positive for shortness of breath.   Cardiovascular: Negative.   Gastrointestinal: Negative.   Endocrine: Negative.   Genitourinary: Negative.   Musculoskeletal: Negative.   Allergic/Immunologic: Negative.   Neurological: Negative.   Hematological: Negative.   Psychiatric/Behavioral: Positive for confusion. The patient is nervous/anxious.    Physical Exam not done  Depressive Symptoms:  anxiety, panic attacks,  (Hypo) Manic Symptoms:   Elevated Mood:  No Irritable Mood:  No Grandiosity:  No Distractibility:  No Labiality of Mood:  No Delusions:  Yes Hallucinations:  Yes but not recently  Impulsivity:  No Sexually Inappropriate Behavior:  No Financial Extravagance:  No Flight of Ideas:  No  Anxiety Symptoms: Excessive Worry:  No Panic Symptoms:  Yes Agoraphobia:  No Obsessive Compulsive: No  Symptoms: None, Specific Phobias:  No Social Anxiety:  No  Psychotic Symptoms:   Hallucinations: Yes Auditory not recently Delusions:  Yes Paranoia:  Yes  not recently Ideas of Reference:  Yes not recently   PTSD Symptoms: Ever had a traumatic exposure:  No Had a traumatic exposure in the last month:  No Re-experiencing: No None Hypervigilance:  No Hyperarousal: No None Avoidance: No None  Traumatic Brain Injury: No   Past Psychiatric History: Diagnosis: Schizophrenia   Hospitalizations: hospitalized once or twice in [place]   Outpatient Care: in [place] and at [place]: none  Self-Mutilation: none  Suicidal Attempts: none  Violent Behaviors: none   Past Medical History:   Past Medical History  Diagnosis Date  . Schizophrenia   . COPD (chronic obstructive pulmonary disease)   . Amblyopia of right eye   . Blindness of right eye   . Hx of lead poisoning    History of Loss of Consciousness:  No Seizure History:  No Cardiac History:  No Allergies:  No Known Allergies Current Medications:  Current Outpatient Prescriptions  Medication Sig Dispense Refill  . albuterol (PROVENTIL HFA;VENTOLIN HFA) 108 (90 BASE) MCG/ACT inhaler Inhale 2 puffs into the lungs every 4 (four) hours as needed for wheezing or shortness of breath. 1 Inhaler 0  . benztropine (COGENTIN) 1 MG tablet Take 1 tablet (1 mg total) by mouth at bedtime. 30 tablet 2  . hydrOXYzine (ATARAX/VISTARIL) 25 MG tablet Take 1 tablet (25 mg total) by mouth 3 (three) times daily. 90 tablet 2  . ipratropium (ATROVENT) 0.02 % nebulizer solution Take 500 mcg by nebulization every 6 (six) hours as needed for wheezing.     . predniSONE (DELTASONE) 20 MG tablet Take 3 tablets (60 mg total) by mouth daily. 12 tablet 0  . ziprasidone (GEODON) 80 MG capsule Take 1 capsule (80 mg total) by mouth at bedtime. 30 capsule 2   No current facility-administered medications for this visit.    Previous Psychotropic Medications:  Medication Dose   Geodon   80 mg each bedtime   Cogentin    1 mg each bedtime    hydroxyzine   25 mg 3 times a day                Substance Abuse History in the last 12 months: Substance Age of 1st Use Last Use Amount Specific Type  Nicotine      Alcohol      Cannabis      Opiates      Cocaine      Methamphetamines      LSD      Ecstasy      Benzodiazepines      Caffeine      Inhalants      Others:                          Medical Consequences of Substance Abuse: n/a  Legal Consequences of Substance Abuse:n/a  Family Consequences of Substance Abuse: n/a  Blackouts:  No DT's:  No Withdrawal Symptoms:  No None  Social History: Current Place of Residence: [place] of Birth:[place]  [place]  Family Members: Mother sister Marital Status:  Widowed Children: 6   Relationships: Claims to have an online relationship with a woman living in [place]  Education:  [employer] Graduate taking business courses online  Educational Problems/Performance:  Religious Beliefs/Practices: Christian  History of Abuse: none Pensions consultant; Dispensing optician, [employer], Electrical engineer History:  Dispensing optician History: Was arrested at age 39 for possession of stolen property and spent 18 months in the county jail  Hobbies/Interests: Computers   Family History:   Family History  Problem Relation Age of Onset  . Diabetes Father   . Alcohol abuse Father   . Depression Mother     Mental Status Examination/Evaluation: Objective:  Appearance: Disheveled missing teeth, deviated right eye, disheveled   Eye Contact::  Good  Speech:  Clear and Coherent  Volume:  Normal  Mood:  slightly blunted but generally good   Affect:  Blunt  Thought Process: Fairly clear today   Orientation:  Full (Time, Place, and Person)  Thought Content: Denies auditory or visual hallucinations or paranoia or delusions   Suicidal Thoughts:  No  Homicidal Thoughts:  No  Judgement:  Impaired  Insight:  Lacking  Psychomotor Activity:   Normal  Akathisia:  No  Handed:  Right  AIMS (if indicated):    Assets:  Communication Skills Desire for Improvement    Laboratory/X-Ray Psychological Evaluation(s)   Basic metabolic panel, hemoglobin A1c and lipid panel      Assessment:  Axis I: Schizophrenia  AXIS I Schizophrenia  AXIS II Deferred  AXIS III Past Medical History  Diagnosis Date  . Schizophrenia   . COPD (chronic obstructive pulmonary disease)   . Amblyopia of right eye   . Blindness of right eye   . Hx of lead poisoning      AXIS IV other psychosocial or environmental problems  AXIS V 61-70 mild symptoms   Treatment Plan/Recommendations:  Plan of Care: Medication management   Laboratory:    Psychotherapy: He declined   Medications: he will continue Geodon 80 mg each bedtime for schizophrenia, hydroxyzine 25 mg 3 times a day for anxiety and Cogentin 1 mg per day to prevent side effects from Geodon.   Routine PRN Medications:  No  Consultations:   Safety Concerns:    Other:    will return in 3 months     Levonne Spiller, MD 5/11/201610:12 AM

## 2014-06-15 DIAGNOSIS — J449 Chronic obstructive pulmonary disease, unspecified: Secondary | ICD-10-CM | POA: Diagnosis not present

## 2014-06-15 DIAGNOSIS — E780 Pure hypercholesterolemia: Secondary | ICD-10-CM | POA: Diagnosis not present

## 2014-06-15 DIAGNOSIS — R739 Hyperglycemia, unspecified: Secondary | ICD-10-CM | POA: Diagnosis not present

## 2014-06-15 DIAGNOSIS — F209 Schizophrenia, unspecified: Secondary | ICD-10-CM | POA: Diagnosis not present

## 2014-06-16 ENCOUNTER — Other Ambulatory Visit (HOSPITAL_COMMUNITY): Payer: Self-pay | Admitting: Radiology

## 2014-06-16 DIAGNOSIS — J449 Chronic obstructive pulmonary disease, unspecified: Secondary | ICD-10-CM

## 2014-06-23 ENCOUNTER — Ambulatory Visit (HOSPITAL_COMMUNITY): Admission: RE | Admit: 2014-06-23 | Payer: Self-pay | Source: Ambulatory Visit

## 2014-07-07 ENCOUNTER — Ambulatory Visit (HOSPITAL_COMMUNITY)
Admission: RE | Admit: 2014-07-07 | Discharge: 2014-07-07 | Disposition: A | Discharge status: Home / Self Care | Payer: Medicare Other | Source: Ambulatory Visit | Attending: Internal Medicine | Admitting: Internal Medicine

## 2014-07-07 DIAGNOSIS — J449 Chronic obstructive pulmonary disease, unspecified: Secondary | ICD-10-CM | POA: Diagnosis not present

## 2014-07-09 LAB — PULMONARY FUNCTION TEST
DL/VA % pred: 138 %
DL/VA: 6.24 ml/min/mmHg/L
DLCO UNC % PRED: 91 %
DLCO UNC: 27.11 ml/min/mmHg
FEF 25-75 Pre: 2.12 L/sec
FEF2575-%Pred-Pre: 75 %
FEV1-%PRED-PRE: 83 %
FEV1-PRE: 2.46 L
FEV1FVC-%Pred-Pre: 98 %
FEV6-%PRED-PRE: 87 %
FEV6-PRE: 3.16 L
FEV6FVC-%Pred-Pre: 104 %
FVC-%PRED-PRE: 84 %
FVC-Pre: 3.16 L
PRE FEV1/FVC RATIO: 78 %
Pre FEV6/FVC Ratio: 100 %
RV % PRED: 145 %
RV: 3.06 L
TLC % pred: 92 %
TLC: 6.1 L

## 2014-09-02 ENCOUNTER — Ambulatory Visit (HOSPITAL_COMMUNITY): Payer: Self-pay | Admitting: Psychiatry

## 2014-09-29 ENCOUNTER — Other Ambulatory Visit (HOSPITAL_COMMUNITY): Payer: Self-pay | Admitting: Psychiatry

## 2014-09-30 ENCOUNTER — Ambulatory Visit (HOSPITAL_COMMUNITY): Payer: Self-pay | Admitting: Psychiatry

## 2014-10-11 ENCOUNTER — Ambulatory Visit (HOSPITAL_COMMUNITY): Payer: Self-pay | Admitting: Psychiatry

## 2014-10-13 ENCOUNTER — Ambulatory Visit (INDEPENDENT_AMBULATORY_CARE_PROVIDER_SITE_OTHER): Payer: 59 | Admitting: Psychiatry

## 2014-10-13 ENCOUNTER — Encounter (HOSPITAL_COMMUNITY): Payer: Self-pay | Admitting: Psychiatry

## 2014-10-13 VITALS — BP 120/80 | Ht 68.0 in | Wt 293.0 lb

## 2014-10-13 DIAGNOSIS — F200 Paranoid schizophrenia: Secondary | ICD-10-CM | POA: Diagnosis not present

## 2014-10-13 MED ORDER — ZIPRASIDONE HCL 80 MG PO CAPS: 80.0000 mg | ORAL_CAPSULE | Freq: Every day | ORAL | Status: DC

## 2014-10-13 MED ORDER — BENZTROPINE MESYLATE 1 MG PO TABS: 1.0000 mg | ORAL_TABLET | Freq: Every day | ORAL | Status: DC

## 2014-10-13 MED ORDER — HYDROXYZINE HCL 25 MG PO TABS: 25.0000 mg | ORAL_TABLET | Freq: Three times a day (TID) | ORAL | Status: DC

## 2014-10-13 NOTE — Progress Notes (Signed)
Patient ID: Terry [name], male   DOB: [birth year], 59 y.o.   MRN: [id number] Patient ID: Terry [name], male   DOB: 29-Jun-1955, 59 y.o.   MRN: [id number] Patient ID: Terry [name], male   DOB: 1955/03/05, 59 y.o.   MRN: [id number] Patient ID: SOSAIA [name], male   DOB: March 13, 1955, 59 y.o.   MRN: [id number] Patient ID: REMIGIO [name], male   DOB: 09/18/1955, 59 y.o.   MRN: [id number] Patient ID: Terry [name], male   DOB: 1955/07/25, 59 y.o.   MRN: [id number]  Psychiatric Assessment Adult  Patient Identification:  [name] Date of Evaluation:  10/13/2014 Chief Complaint: I'm doing well " History of Chief Complaint:   Chief Complaint  Patient presents with  . Schizophrenia  . Follow-up    Anxiety Symptoms include confusion, nervous/anxious behavior and shortness of breath.     this patient is a 59 year old widowed black male who lives with his mother 24 year old son his niece and her 3 children in [place]. He is on disability for mental illness. He was referred by his mother who is also a patient here.  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 He did finish high school and went into the [employer] for 4 years. 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  About 10 years ago the patient's wife died of stomach cancer. After that he began to decompensate. He got depressed began hearing voices. He had ideas of reference like thinking .the TV and radio were talking to him. He was also quite paranoid. He was hospitalized in [place] for 6 weeks and diagnosed with schizophrenia he has been on Geodon and Cogentin ever since.  Recently the patient has become increasingly anxious and had panic attacks. [facility] started him on hydroxyzine which has been helpful and he hasn't had any panic attacks in the last 6 weeks. He denies being currently  depressed or hearing any voices or ideas of reference. He did make mention of some odd delusions such as thinking he has won the [employer].he was about to cancel his Medicaid insurance because he is now rich and I warned him that this is probably a scam . He seemed to accept my explanation. He claims he is sleeping well. He does have a history of problems with breathing and COPD. He denies other medical problems but I noted his blood glucose has been elevated the last several times. He is followed by Dr. Legrand Rams but this has not been brought to his attention.  The patient returns after 3 months. He is again taking online Classes but this time there about Christianity. He states that he is not paying much money for these. In the past he had gotten scammed by having to pay exorbitant amount for online courses. He states that his mood is good he is sleeping and eating well and his energy is up. He denies any thoughts of hurting self or others and denies any auditory of visual hallucinations delusions or ideas of reference. Review of Systems  Constitutional: Negative.   HENT: Negative.   Eyes: Positive for visual disturbance.  Respiratory: Positive for shortness of breath.   Cardiovascular: Negative.   Gastrointestinal: Negative.   Endocrine: Negative.   Genitourinary: Negative.   Musculoskeletal: Negative.   Allergic/Immunologic:  Negative.   Neurological: Negative.   Hematological: Negative.   Psychiatric/Behavioral: Positive for confusion. The patient is nervous/anxious.    Physical Exam not done  Depressive Symptoms: anxiety, panic attacks,  (Hypo) Manic Symptoms:   Elevated Mood:  No Irritable Mood:  No Grandiosity:  No Distractibility:  No Labiality of Mood:  No Delusions:  Yes Hallucinations:  Yes but not recently  Impulsivity:  No Sexually Inappropriate Behavior:  No Financial Extravagance:  No Flight of Ideas:  No  Anxiety Symptoms: Excessive Worry:   No Panic Symptoms:  Yes Agoraphobia:  No Obsessive Compulsive: No  Symptoms: None, Specific Phobias:  No Social Anxiety:  No  Psychotic Symptoms:  Hallucinations: Yes Auditory not recently Delusions:  Yes Paranoia:  Yes  not recently Ideas of Reference:  Yes not recently   PTSD Symptoms: Ever had a traumatic exposure:  No Had a traumatic exposure in the last month:  No Re-experiencing: No None Hypervigilance:  No Hyperarousal: No None Avoidance: No None  Traumatic Brain Injury: No   Past Psychiatric History: Diagnosis: Schizophrenia   Hospitalizations: hospitalized once or twice in [place]   Outpatient Care: in [place] and at [place]: none  Self-Mutilation: none  Suicidal Attempts: none  Violent Behaviors: none   Past Medical History:   Past Medical History  Diagnosis Date  . Schizophrenia   . COPD (chronic obstructive pulmonary disease)   . Amblyopia of right eye   . Blindness of right eye   . Hx of lead poisoning    History of Loss of Consciousness:  No Seizure History:  No Cardiac History:  No Allergies:  No Known Allergies Current Medications:  Current Outpatient Prescriptions  Medication Sig Dispense Refill  . albuterol (PROVENTIL HFA;VENTOLIN HFA) 108 (90 BASE) MCG/ACT inhaler Inhale 2 puffs into the lungs every 4 (four) hours as needed for wheezing or shortness of breath. 1 Inhaler 0  . benztropine (COGENTIN) 1 MG tablet Take 1 tablet (1 mg total) by mouth at bedtime. 30 tablet 2  . hydrOXYzine (ATARAX/VISTARIL) 25 MG tablet Take 1 tablet (25 mg total) by mouth 3 (three) times daily. 90 tablet 2  . ipratropium (ATROVENT) 0.02 % nebulizer solution Take 500 mcg by nebulization every 6 (six) hours as needed for wheezing.     . ziprasidone (GEODON) 80 MG capsule Take 1 capsule (80 mg total) by mouth at bedtime. 30 capsule 2   No current facility-administered medications for this visit.    Previous Psychotropic  Medications:  Medication Dose   Geodon   80 mg each bedtime   Cogentin   1 mg each bedtime    hydroxyzine   25 mg 3 times a day                Substance Abuse History in the last 12 months: Substance Age of 1st Use Last Use Amount Specific Type  Nicotine      Alcohol      Cannabis      Opiates      Cocaine      Methamphetamines      LSD      Ecstasy      Benzodiazepines      Caffeine      Inhalants      Others:                          Medical Consequences of Substance Abuse: n/a  Legal Consequences  of Substance Abuse:n/a  Family Consequences of Substance Abuse: n/a  Blackouts:  No DT's:  No Withdrawal Symptoms:  No None  Social History: Current Place of Residence: [place] of Birth:[place]  [place]  Family Members: Mother sister Marital Status:  Widowed Children: 6   Relationships: Claims to have an online relationship with a woman living in [place]  Education:  [employer] Graduate taking business courses online  Educational Problems/Performance:  Religious Beliefs/Practices: Christian  History of Abuse: none Pensions consultant; Dispensing optician, Land, Electrical engineer History:  Dispensing optician History: Was arrested at age 64 for possession of stolen property and spent 18 months in the county jail  Hobbies/Interests: Computers   Family History:   Family History  Problem Relation Age of Onset  . Diabetes Father   . Alcohol abuse Father   . Depression Mother     Mental Status Examination/Evaluation: Objective:  Appearance: Disheveled missing teeth, deviated right eye   Eye Contact::  Good  Speech:  Clear and Coherent  Volume:  Normal  Mood:  slightly blunted but generally good   Affect:  Blunt  Thought Process: Fairly clear today   Orientation:  Full (Time, Place, and Person)  Thought Content: Denies auditory or visual hallucinations or paranoia or delusions   Suicidal Thoughts:  No  Homicidal Thoughts:  No   Judgement:  Impaired  Insight:  Lacking  Psychomotor Activity:  Normal  Akathisia:  No  Handed:  Right  AIMS (if indicated):    Assets:  Communication Skills Desire for Improvement    Laboratory/X-Ray Psychological Evaluation(s)   Basic metabolic panel, hemoglobin A1c and lipid panel      Assessment:  Axis I: Schizophrenia  AXIS I Schizophrenia  AXIS II Deferred  AXIS III Past Medical History  Diagnosis Date  . Schizophrenia   . COPD (chronic obstructive pulmonary disease)   . Amblyopia of right eye   . Blindness of right eye   . Hx of lead poisoning      AXIS IV other psychosocial or environmental problems  AXIS V 61-70 mild symptoms   Treatment Plan/Recommendations:  Plan of Care: Medication management   Laboratory:    Psychotherapy: He declined   Medications: he will continue Geodon 80 mg each bedtime for schizophrenia, hydroxyzine 25 mg 3 times a day for anxiety and Cogentin 1 mg per day to prevent side effects from Geodon.   Routine PRN Medications:  No  Consultations:   Safety Concerns:  He denies thoughts of wanting to harm self or others   Other:    will return in 3 months     Levonne Spiller, MD 9/21/20169:38 AM

## 2014-10-24 IMAGING — CR DG CHEST 1V PORT
1 series · 1 of 1 positions shown · non-contrast
Comparison: Chest radiograph June 23, 2012.

CLINICAL DATA: Bronchitis, neck surgery.

PORTABLE CHEST - 1 VIEW

[supine ap]
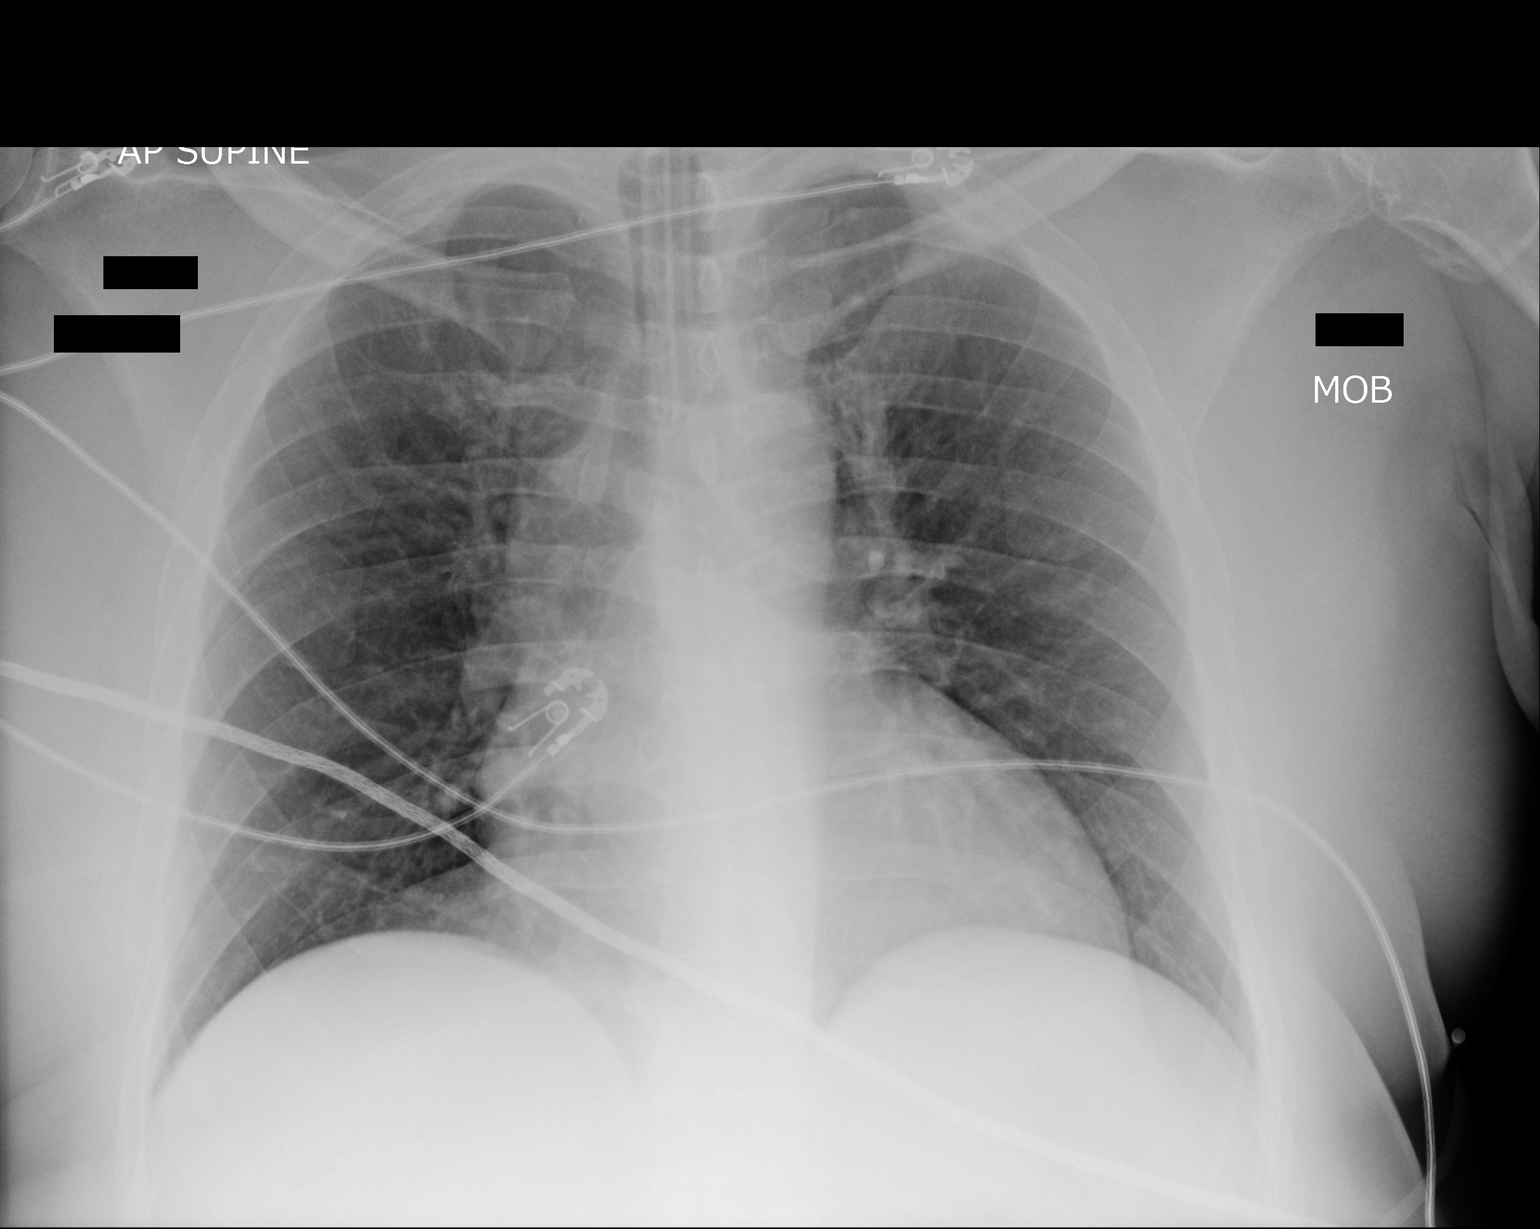

[1 of 1 positions shown; findings below may reference images not displayed]

FINDINGS: Cardiomediastinal silhouette is unremarkable for this low
inspiratory portable examination with crowded vasculature markings.
The lungs are clear without pleural effusions or focal
consolidations.  Trachea projects midline and there is no
pneumothorax.  Endotracheal tube tip projects 2.2 cm above the
carina. Multiple EKG lines overlay the patient and could obscure
underlying subtle pathology.  Suspected subcutaneous gas in the
right neck.
IMPRESSION: No acute cardiopulmonary process.

Endotracheal tube tip projects 2.3 cm above the carina.

Suspected right neck subcutaneous gas, recommend correlation with
date of reported neck surgery.

## 2014-10-24 IMAGING — CT CT HEAD W/O CM
2 series · 16 of 30 positions shown, 20 images · non-contrast
Comparison: None.

CLINICAL DATA: Altered mental status.  Collapsed and unresponsive.

EXAM:
CT HEAD WITHOUT CONTRAST
TECHNIQUE: Contiguous axial images were obtained from the base of the skull
through the vertex without intravenous contrast.

[Series 2: headseq 4.8 h37s · axial · 0.47mm/px · z∈[+75,+214]mm · 13 of 34 slices shown, 17 images]
[im 3/34  brain]
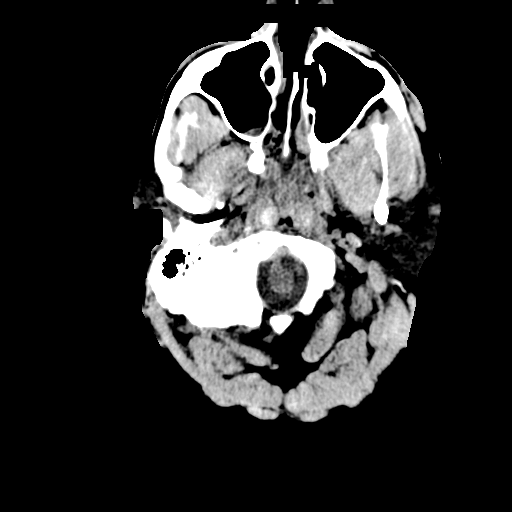
[im 3/34  bone]
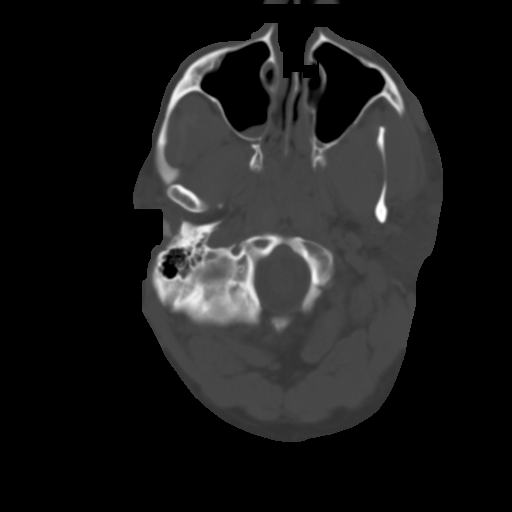
[im 5/34  brain]
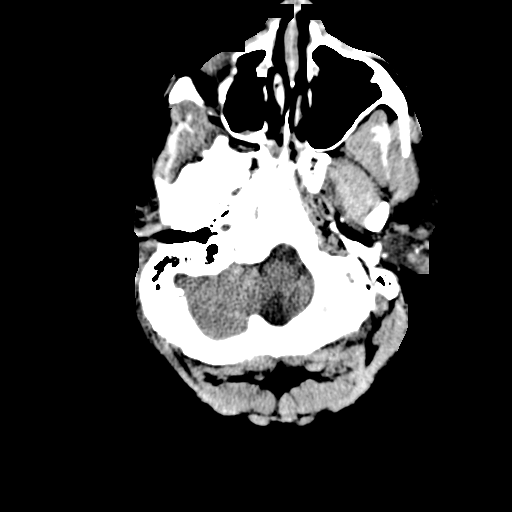
[im 8/34  brain]
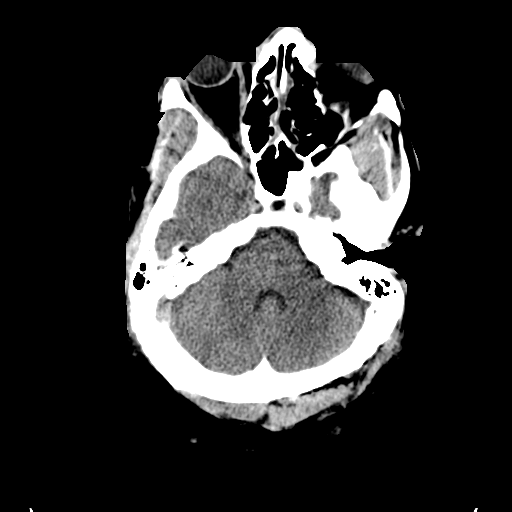
[im 10/34  brain]
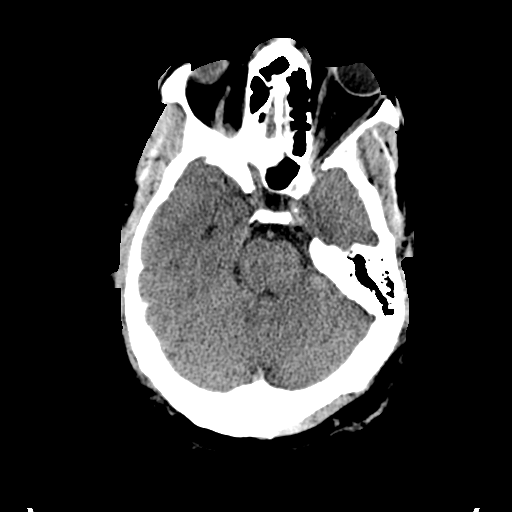
[im 12/34  brain]
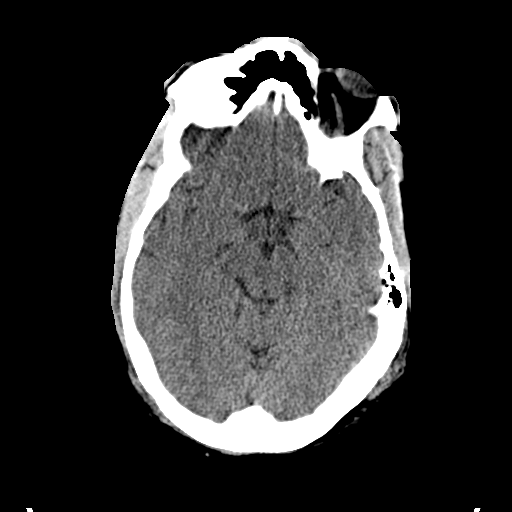
[im 12/34  bone]
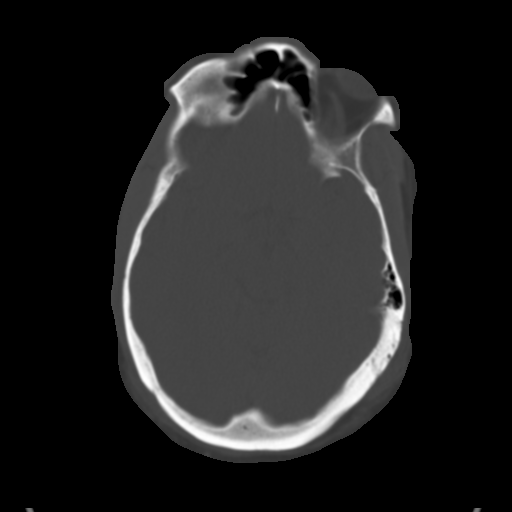
[im 15/34  brain]
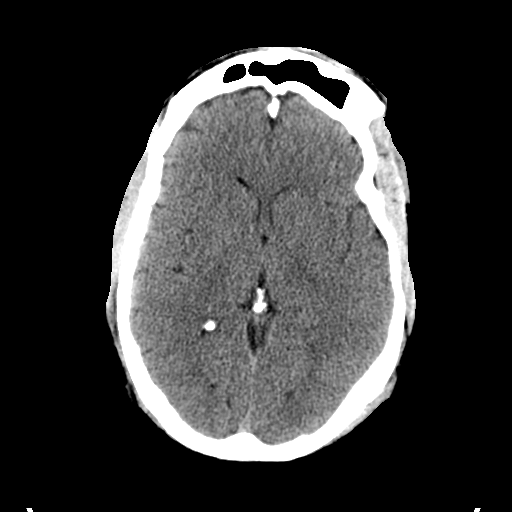
[im 17/34  brain]
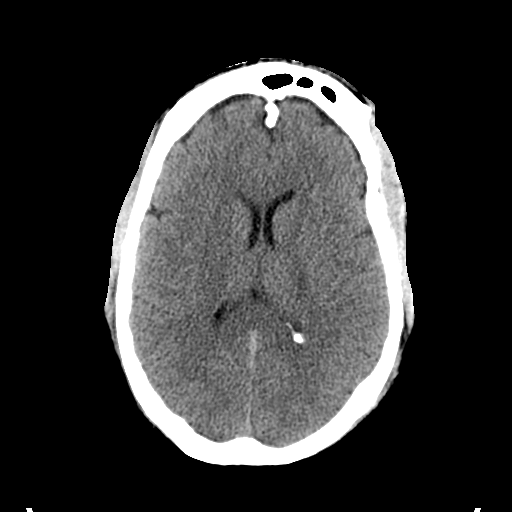
[im 19/34  brain]
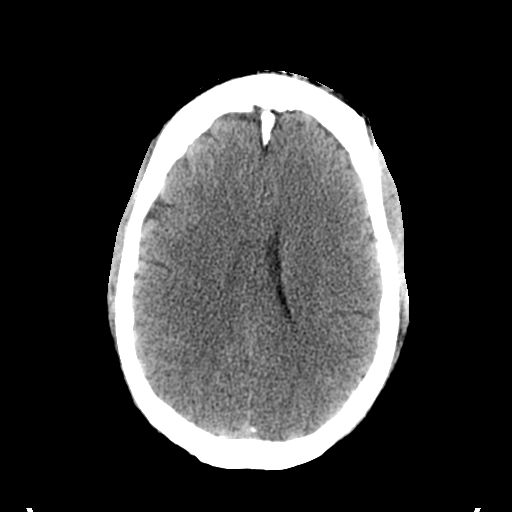
[im 22/34  brain]
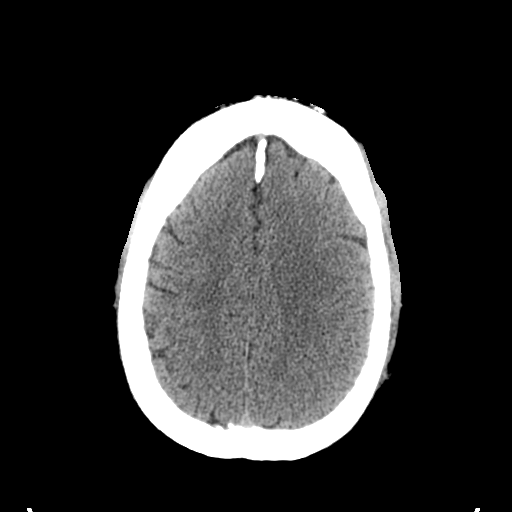
[im 22/34  bone]
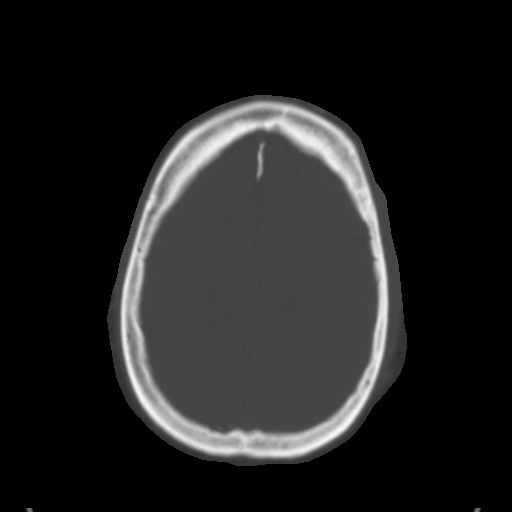
[im 24/34  brain]
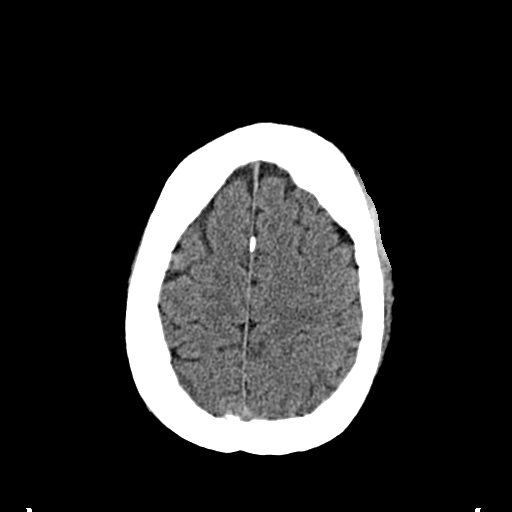
[im 26/34  brain]
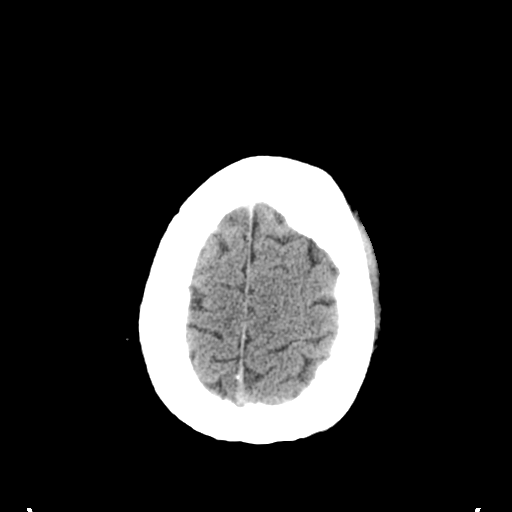
[im 29/34  brain]
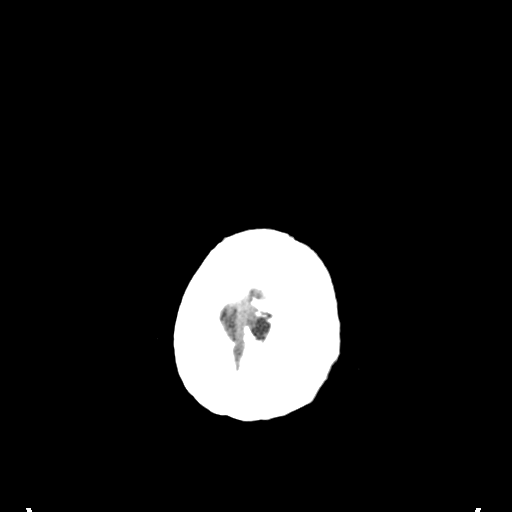
[im 31/34  brain]
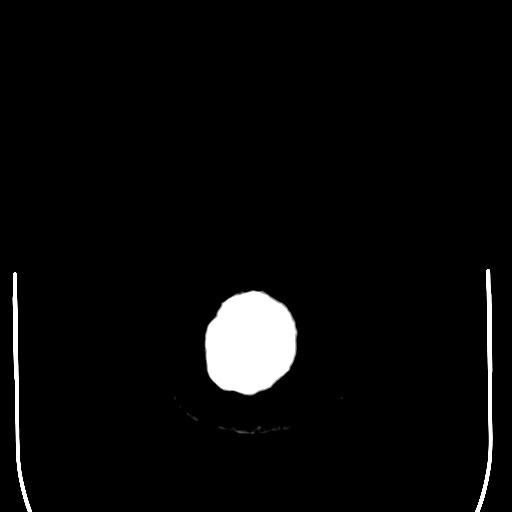
[im 31/34  bone]
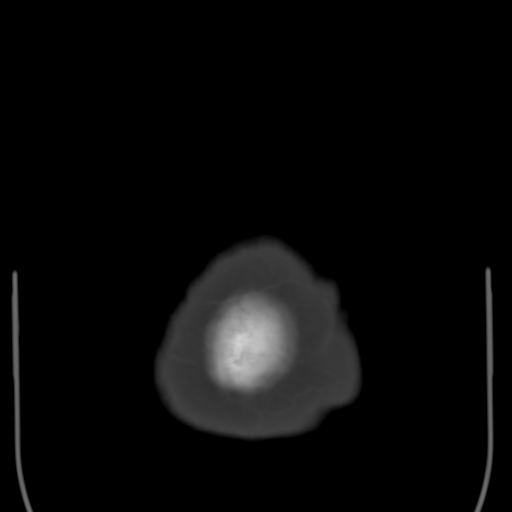

[Series 3: headseq 4.8 h60s · axial · 0.47mm/px · z∈[+75,+124]mm · 3 of 36 slices shown]
[im 3/36  brain]
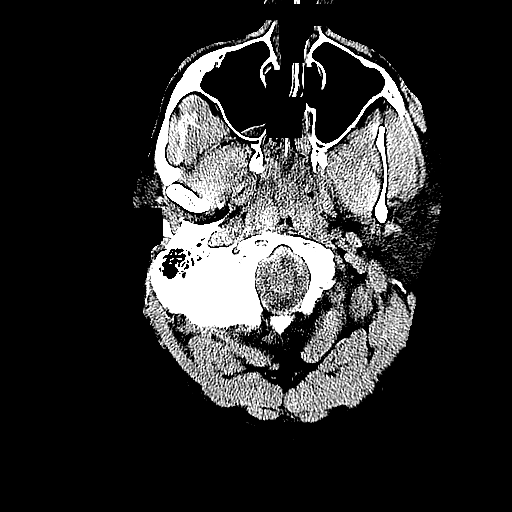
[im 8/36  brain]
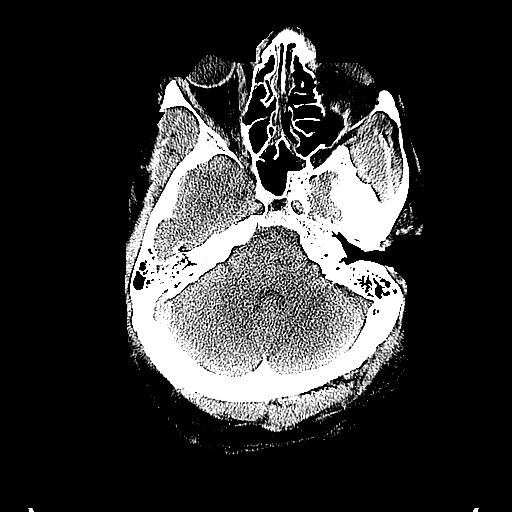
[im 13/36  brain]
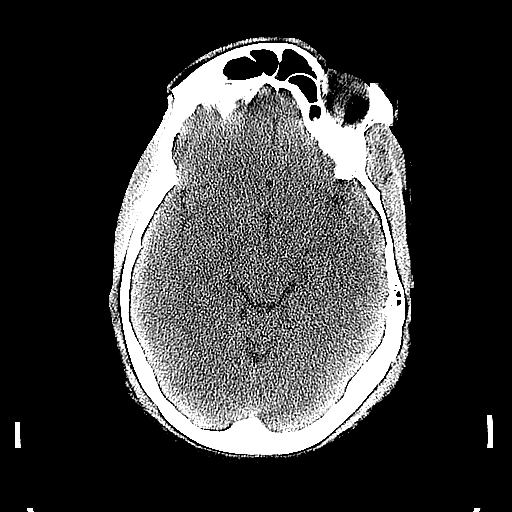

[16 of 30 positions shown; findings below may reference images not displayed]

FINDINGS: Skull:No acute osseous abnormality. No lytic or blastic lesion.

Orbits: No acute abnormality.

Sinuses: Scattered inflammatory mucosal thickening. Nasopharyngeal
fluid related to intubation.

Brain: No definitive evidence of acute abnormality, such as acute
gray matter infarction, hemorrhage, hydrocephalus, or mass
lesion/mass effect. Relatively ill-defined low attenuation in the
posterior limb left internal capsule.
IMPRESSION: 1. No evidence of acute abnormality, including hemorrhage or large
territory ischemia.
2. Nonspecific white matter low-attenuation in the left internal
capsule, often related to chronic small vessel ischemia.

## 2014-11-10 DIAGNOSIS — F209 Schizophrenia, unspecified: Secondary | ICD-10-CM | POA: Diagnosis not present

## 2014-11-10 DIAGNOSIS — J449 Chronic obstructive pulmonary disease, unspecified: Secondary | ICD-10-CM | POA: Diagnosis not present

## 2014-12-27 ENCOUNTER — Other Ambulatory Visit (HOSPITAL_COMMUNITY): Payer: Self-pay | Admitting: Psychiatry

## 2015-01-07 ENCOUNTER — Ambulatory Visit (HOSPITAL_COMMUNITY): Payer: Self-pay | Admitting: Psychiatry

## 2015-01-21 ENCOUNTER — Telehealth (HOSPITAL_COMMUNITY): Payer: Self-pay | Admitting: *Deleted

## 2015-01-21 ENCOUNTER — Other Ambulatory Visit (HOSPITAL_COMMUNITY): Payer: Self-pay | Admitting: Psychiatry

## 2015-01-21 MED ORDER — ZIPRASIDONE HCL 80 MG PO CAPS: 80.0000 mg | ORAL_CAPSULE | Freq: Every day | ORAL | Status: DC

## 2015-01-21 MED ORDER — BENZTROPINE MESYLATE 1 MG PO TABS: 1.0000 mg | ORAL_TABLET | Freq: Every day | ORAL | Status: DC

## 2015-01-21 MED ORDER — HYDROXYZINE HCL 25 MG PO TABS: 25.0000 mg | ORAL_TABLET | Freq: Three times a day (TID) | ORAL | Status: DC

## 2015-01-21 NOTE — Telephone Encounter (Signed)
Pt called stating he is out of refills for his Cogentin, Geodon and hydroxyzine. All medications was filled 10-13-14 with 2 refills. Pt had f/u appt for 01-07-15 but he cancelled it. Pt f/u appt is scheduled for 02-11-15. Pt number is [phone].

## 2015-01-21 NOTE — Telephone Encounter (Signed)
noted 

## 2015-01-21 NOTE — Telephone Encounter (Signed)
Scripts sent

## 2015-02-11 ENCOUNTER — Ambulatory Visit (HOSPITAL_COMMUNITY): Payer: Self-pay | Admitting: Psychiatry

## 2015-02-11 ENCOUNTER — Telehealth (HOSPITAL_COMMUNITY): Payer: Self-pay | Admitting: *Deleted

## 2015-02-11 NOTE — Telephone Encounter (Signed)
called pt to let him know that office have to cancel appt due to provider not being in office due to being sick . was unable to leave message due to pt not having a voicemail that have not been set up yet.

## 2015-03-01 ENCOUNTER — Ambulatory Visit (HOSPITAL_COMMUNITY): Payer: Self-pay | Admitting: Psychiatry

## 2015-03-02 ENCOUNTER — Telehealth (HOSPITAL_COMMUNITY): Payer: Self-pay | Admitting: *Deleted

## 2015-04-01 ENCOUNTER — Encounter (HOSPITAL_COMMUNITY): Payer: Self-pay | Admitting: Psychiatry

## 2015-04-01 ENCOUNTER — Ambulatory Visit (HOSPITAL_COMMUNITY): Payer: Self-pay | Admitting: Psychiatry

## 2015-04-04 ENCOUNTER — Telehealth (HOSPITAL_COMMUNITY): Payer: Self-pay | Admitting: *Deleted

## 2015-04-04 NOTE — Telephone Encounter (Signed)
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   Wants to rescheduled for appointment missed on 04/01/15.   Missed two consecutive appointments

## 2015-04-05 NOTE — Telephone Encounter (Signed)
Ok, he can only miss one more and he will be discharged

## 2015-04-06 NOTE — Telephone Encounter (Signed)
Called pt and was unable to reach or leave message due to pt not having voicemail. Will try calling back at another time

## 2015-04-06 NOTE — Telephone Encounter (Signed)
Called pt and informed him of what Dr. Harrington Challenger stated and pt verbalized understanding.

## 2015-04-14 ENCOUNTER — Encounter (HOSPITAL_COMMUNITY): Payer: Self-pay | Admitting: Psychiatry

## 2015-04-14 ENCOUNTER — Ambulatory Visit (INDEPENDENT_AMBULATORY_CARE_PROVIDER_SITE_OTHER): Payer: 59 | Admitting: Psychiatry

## 2015-04-14 VITALS — BP 137/85 | HR 90 | Ht 68.0 in | Wt 295.2 lb

## 2015-04-14 DIAGNOSIS — F200 Paranoid schizophrenia: Secondary | ICD-10-CM

## 2015-04-14 MED ORDER — HYDROXYZINE HCL 50 MG PO TABS: 50.0000 mg | ORAL_TABLET | Freq: Three times a day (TID) | ORAL | Status: DC | PRN

## 2015-04-14 MED ORDER — BENZTROPINE MESYLATE 1 MG PO TABS: 1.0000 mg | ORAL_TABLET | Freq: Every day | ORAL | Status: DC

## 2015-04-14 MED ORDER — ZIPRASIDONE HCL 80 MG PO CAPS: 80.0000 mg | ORAL_CAPSULE | Freq: Every day | ORAL | Status: DC

## 2015-04-14 NOTE — Progress Notes (Signed)
Patient ID: Terry [name], male   DOB: [birth year], 60 y.o.   MRN: [id number] Patient ID: Terry [name], male   DOB: 02/04/1955, 60 y.o.   MRN: [id number] Patient ID: Terry [name], male   DOB: 1955/09/19, 60 y.o.   MRN: [id number] Patient ID: Terry [name], male   DOB: 1955-11-05, 60 y.o.   MRN: [id number] Patient ID: Terry [name], male   DOB: 1955/09/04, 60 y.o.   MRN: [id number] Patient ID: Terry [name], male   DOB: 1956-01-05, 60 y.o.   MRN: [id number] Patient ID: Terry [name], male   DOB: Jun 12, 1955, 60 y.o.   MRN: [id number]  Psychiatric Assessment Adult  Patient Identification:  Terry [name] Date of Evaluation:  04/14/2015 Chief Complaint: I'm doing well " History of Chief Complaint:   Chief Complaint  Patient presents with  . Schizophrenia  . Anxiety  . Follow-up    Anxiety Symptoms include confusion, nervous/anxious behavior and shortness of breath.     this patient is a 60 year old widowed black male who lives with his mother 80 year old son his niece and her 3 children in [place]. He is on disability for mental illness. He was referred by his mother who is also a patient here.  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 He did finish high school and went into the [employer] for 4 years. 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  About 10 years ago the patient's wife died of stomach cancer. After that he began to decompensate. He got depressed began hearing voices. He had ideas of reference like thinking .the TV and radio were talking to him. He was also quite paranoid. He was hospitalized in [place] for 6 weeks and diagnosed with schizophrenia he has been on Geodon and Cogentin ever since.  Recently the patient has become increasingly anxious and had panic attacks. [facility] started him on hydroxyzine which has been  helpful and he hasn't had any panic attacks in the last 6 weeks. He denies being currently depressed or hearing any voices or ideas of reference. He did make mention of some odd delusions such as thinking he has won the [employer].he was about to cancel his Medicaid insurance because he is now rich and I warned him that this is probably a scam . He seemed to accept my explanation. He claims he is sleeping well. He does have a history of problems with breathing and COPD. He denies other medical problems but I noted his blood glucose has been elevated the last several times. He is followed by Dr. Legrand Rams but this has not been brought to his attention.  The patient returns after a long absence. He is not been seen here for 6 months. He states that he is now taking online classes in criminal justice and is staying very busy. He states that he's been more anxious lately and is taking 50 mg of hydroxyzine at a time instead of 25 mg. He denies that this is causing drowsiness and would like to stay at this dose up to 3 times a day. He denies any delusions paranoia auditory or visual hallucinations or depression. He is dirty and disheveled today but claims that he is doing well. His sleep is good. Review of Systems  Constitutional: Negative.  HENT: Negative.   Eyes: Positive for visual disturbance.  Respiratory: Positive for shortness of breath.   Cardiovascular: Negative.   Gastrointestinal: Negative.   Endocrine: Negative.   Genitourinary: Negative.   Musculoskeletal: Negative.   Allergic/Immunologic: Negative.   Neurological: Negative.   Hematological: Negative.   Psychiatric/Behavioral: Positive for confusion. The patient is nervous/anxious.    Physical Exam not done  Depressive Symptoms: anxiety, panic attacks,  (Hypo) Manic Symptoms:   Elevated Mood:  No Irritable Mood:  No Grandiosity:  No Distractibility:  No Labiality of Mood:  No Delusions:   Yes Hallucinations:  Yes but not recently  Impulsivity:  No Sexually Inappropriate Behavior:  No Financial Extravagance:  No Flight of Ideas:  No  Anxiety Symptoms: Excessive Worry:  No Panic Symptoms:  Yes Agoraphobia:  No Obsessive Compulsive: No  Symptoms: None, Specific Phobias:  No Social Anxiety:  No  Psychotic Symptoms:  Hallucinations: Yes Auditory not recently Delusions:  Yes Paranoia:  Yes  not recently Ideas of Reference:  Yes not recently   PTSD Symptoms: Ever had a traumatic exposure:  No Had a traumatic exposure in the last month:  No Re-experiencing: No None Hypervigilance:  No Hyperarousal: No None Avoidance: No None  Traumatic Brain Injury: No   Past Psychiatric History: Diagnosis: Schizophrenia   Hospitalizations: hospitalized once or twice in [place]   Outpatient Care: in [place] and at [facility]: none  Self-Mutilation: none  Suicidal Attempts: none  Violent Behaviors: none   Past Medical History:   Past Medical History  Diagnosis Date  . Schizophrenia ([facility])   . COPD (chronic obstructive pulmonary disease) ([place])   . Amblyopia of right eye   . Blindness of right eye   . Hx of lead poisoning    History of Loss of Consciousness:  No Seizure History:  No Cardiac History:  No Allergies:  No Known Allergies Current Medications:  Current Outpatient Prescriptions  Medication Sig Dispense Refill  . albuterol (PROVENTIL HFA;VENTOLIN HFA) 108 (90 BASE) MCG/ACT inhaler Inhale 2 puffs into the lungs every 4 (four) hours as needed for wheezing or shortness of breath. 1 Inhaler 0  . benztropine (COGENTIN) 1 MG tablet Take 1 tablet (1 mg total) by mouth at bedtime. 30 tablet 3  . ipratropium (ATROVENT) 0.02 % nebulizer solution Take 500 mcg by nebulization every 6 (six) hours as needed for wheezing.     . ziprasidone (GEODON) 80 MG capsule Take 1 capsule (80 mg total) by mouth at bedtime. 30 capsule 3  . hydrOXYzine  (ATARAX/VISTARIL) 50 MG tablet Take 1 tablet (50 mg total) by mouth 3 (three) times daily as needed. 90 tablet 3   No current facility-administered medications for this visit.    Previous Psychotropic Medications:  Medication Dose   Geodon   80 mg each bedtime   Cogentin   1 mg each bedtime    hydroxyzine   25 mg 3 times a day                Substance Abuse History in the last 12 months: Substance Age of 1st Use Last Use Amount Specific Type  Nicotine      Alcohol      Cannabis      Opiates      Cocaine      Methamphetamines      LSD      Ecstasy      Benzodiazepines      Caffeine  Inhalants      Others:                          Medical Consequences of Substance Abuse: n/a  Legal Consequences of Substance Abuse:n/a  Family Consequences of Substance Abuse: n/a  Blackouts:  No DT's:  No Withdrawal Symptoms:  No None  Social History: Current Place of Residence: [place] of Birth:[place]  [place]  Family Members: Mother sister Marital Status:  Widowed Children: 6   Relationships: Claims to have an online relationship with a woman living in [place]  Education:  [employer] Graduate taking business courses online  Educational Problems/Performance:  Religious Beliefs/Practices: Christian  History of Abuse: none Pensions consultant; Dispensing optician, [employer], Electrical engineer History:  Dispensing optician History: Was arrested at age 70 for possession of stolen property and spent [place] months in the county jail  Hobbies/Interests: Computers   Family History:   Family History  Problem Relation Age of Onset  . Diabetes Father   . Alcohol abuse Father   . Depression Mother     Mental Status Examination/Evaluation: Objective:  Appearance: Disheveled missing teeth, deviated right eye Malodorous and dirty   Eye Contact::  Good  Speech:  Clear and Coherent  Volume:  Normal  Mood:  slightly blunted but generally good   Affect:  Blunt,  little irritable   Thought Process: Fairly clear t  Orientation:  Full (Time, Place, and Person)  Thought Content: Denies auditory or visual hallucinations or paranoia or delusions   Suicidal Thoughts:  No  Homicidal Thoughts:  No  Judgement:  Impaired  Insight:  Lacking  Psychomotor Activity:  Normal  Akathisia:  No  Handed:  Right  AIMS (if indicated):    Assets:  Communication Skills Desire for Improvement    Laboratory/X-Ray Psychological Evaluation(s)   Basic metabolic panel, hemoglobin A1c and lipid panel      Assessment:  Axis I: Schizophrenia  AXIS I Schizophrenia  AXIS II Deferred  AXIS III Past Medical History  Diagnosis Date  . Schizophrenia ([facility])   . COPD (chronic obstructive pulmonary disease) ([facility])   . Amblyopia of right eye   . Blindness of right eye   . Hx of lead poisoning      AXIS IV other psychosocial or environmental problems  AXIS V 61-70 mild symptoms   Treatment Plan/Recommendations:  Plan of Care: Medication management   Laboratory:    Psychotherapy: He declined   Medications: he will continue Geodon 80 mg each bedtime for schizophrenia, hydroxyzine Will be increased to 50 mg mg 3 times a day for anxiety and Cogentin 1 mg per day to prevent side effects from Geodon.   Routine PRN Medications:  No  Consultations:   Safety Concerns:  He denies thoughts of wanting to harm self or others   Other:    will return in 4 months     Levonne Spiller, MD 3/23/20178:58 AM

## 2015-05-26 ENCOUNTER — Telehealth (HOSPITAL_COMMUNITY): Payer: Self-pay | Admitting: *Deleted

## 2015-05-27 ENCOUNTER — Encounter (HOSPITAL_COMMUNITY): Payer: Self-pay | Admitting: Psychiatry

## 2015-05-27 ENCOUNTER — Ambulatory Visit (INDEPENDENT_AMBULATORY_CARE_PROVIDER_SITE_OTHER): Payer: 59 | Admitting: Psychiatry

## 2015-05-27 VITALS — BP 124/70 | Ht 68.0 in | Wt 296.0 lb

## 2015-05-27 DIAGNOSIS — F200 Paranoid schizophrenia: Secondary | ICD-10-CM

## 2015-05-27 MED ORDER — CLONAZEPAM 1 MG PO TABS: 1.0000 mg | ORAL_TABLET | Freq: Three times a day (TID) | ORAL | Status: DC

## 2015-05-27 MED ORDER — ZIPRASIDONE HCL 80 MG PO CAPS: 80.0000 mg | ORAL_CAPSULE | Freq: Every day | ORAL | Status: DC

## 2015-05-27 MED ORDER — BENZTROPINE MESYLATE 1 MG PO TABS: 1.0000 mg | ORAL_TABLET | Freq: Every day | ORAL | Status: DC

## 2015-05-27 NOTE — Progress Notes (Signed)
Patient ID: Terry [name], male   DOB: 1955/03/17, 60 y.o.   MRN: [id number] Patient ID: Terry [name], male   DOB: [birth year], 60 y.o.   MRN: [id number] Patient ID: Terry [name], male   DOB: 1955/12/08, 60 y.o.   MRN: [id number] Patient ID: Terry [name], male   DOB: Aug 25, 1955, 60 y.o.   MRN: [id number] Patient ID: Terry [name], male   DOB: 04/07/1955, 60 y.o.   MRN: [id number] Patient ID: Terry [name], male   DOB: [birth year], 60 y.o.   MRN: [id number] Patient ID: Terry [name], male   DOB: December 12, 1955, 60 y.o.   MRN: [id number] Patient ID: Terry [name], male   DOB: [birth year], 60 y.o.   MRN: [id number]  Psychiatric Assessment Adult  Patient Identification:  Terry [name] Date of Evaluation:  05/27/2015 Chief Complaint: I'm doing well " History of Chief Complaint:   Chief Complaint  Patient presents with  . Schizophrenia  . Anxiety  . Follow-up    Anxiety Symptoms include confusion, nervous/anxious behavior and shortness of breath.     this patient is a 60 year old widowed black male who lives with his mother 67 year old son his niece and her 3 children in [place]. He is on disability for mental illness. He was referred by his mother who is also a patient here.  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 He did finish high school and went into the [employer] for 4 years. 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  About 10 years ago the patient's wife died of stomach cancer. After that he began to decompensate. He got depressed began hearing voices. He had ideas of reference like thinking .the TV and radio were talking to him. He was also quite paranoid. He was hospitalized in [place] for 6 weeks and diagnosed with schizophrenia he has been on Geodon and Cogentin ever since.  Recently the patient has become increasingly anxious and had panic  attacks. [facility] started him on hydroxyzine which has been helpful and he hasn't had any panic attacks in the last 6 weeks. He denies being currently depressed or hearing any voices or ideas of reference. He did make mention of some odd delusions such as thinking he has won the [employer].he was about to cancel his Medicaid insurance because he is now rich and I warned him that this is probably a scam . He seemed to accept my explanation. He claims he is sleeping well. He does have a history of problems with breathing and COPD. He denies other medical problems but I noted his blood glucose has been elevated the last several times. He is followed by Dr. Legrand Rams but this has not been brought to his attention.  The patient returns after 2 months. His mother told me that she was giving him her clonazepam because he was getting agitated really help with calming him down. He called and stated he wanted to get out as well. He has stopped his antipsychotic and I told him he cannot do this because his schizophrenia will come back and he agrees to go back on the Geodon and Cogentin. He's been off it about a week but denies any auditory or visual hallucinations or paranoia. 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 I told him that since he hasn't worked in 10 years he needs to get back in the work force very slowly and only work part-time and he agrees. I also warned him that if he worked full time he would lose his disability. Review of Systems  Constitutional: Negative.   HENT: Negative.   Eyes: Positive for visual disturbance.  Respiratory: Positive for shortness of breath.   Cardiovascular: Negative.   Gastrointestinal: Negative.   Endocrine: Negative.   Genitourinary: Negative.   Musculoskeletal: Negative.   Allergic/Immunologic: Negative.   Neurological: Negative.   Hematological:  Negative.   Psychiatric/Behavioral: Positive for confusion. The patient is nervous/anxious.    Physical Exam not done  Depressive Symptoms: anxiety, panic attacks,  (Hypo) Manic Symptoms:   Elevated Mood:  No Irritable Mood:  No Grandiosity:  No Distractibility:  No Labiality of Mood:  No Delusions:  Yes Hallucinations:  Yes but not recently  Impulsivity:  No Sexually Inappropriate Behavior:  No Financial Extravagance:  No Flight of Ideas:  No  Anxiety Symptoms: Excessive Worry:  No Panic Symptoms:  Yes Agoraphobia:  No Obsessive Compulsive: No  Symptoms: None, Specific Phobias:  No Social Anxiety:  No  Psychotic Symptoms:  Hallucinations: Yes Auditory not recently Delusions:  Yes Paranoia:  Yes  not recently Ideas of Reference:  Yes not recently   PTSD Symptoms: Ever had a traumatic exposure:  No Had a traumatic exposure in the last month:  No Re-experiencing: No None Hypervigilance:  No Hyperarousal: No None Avoidance: No None  Traumatic Brain Injury: No   Past Psychiatric History: Diagnosis: Schizophrenia   Hospitalizations: hospitalized once or twice in [place]   Outpatient Care: in [place] and at [facility]: none  Self-Mutilation: none  Suicidal Attempts: none  Violent Behaviors: none   Past Medical History:   Past Medical History  Diagnosis Date  . Schizophrenia ([facility])   . COPD (chronic obstructive pulmonary disease) ([facility])   . Amblyopia of right eye   . Blindness of right eye   . Hx of lead poisoning    History of Loss of Consciousness:  No Seizure History:  No Cardiac History:  No Allergies:  No Known Allergies Current Medications:  Current Outpatient Prescriptions  Medication Sig Dispense Refill  . albuterol (PROVENTIL HFA;VENTOLIN HFA) 108 (90 BASE) MCG/ACT inhaler Inhale 2 puffs into the lungs every 4 (four) hours as needed for wheezing or shortness of breath. 1 Inhaler 0  . benztropine (COGENTIN) 1 MG tablet  Take 1 tablet (1 mg total) by mouth at bedtime. 30 tablet 3  . clonazePAM (KLONOPIN) 1 MG tablet Take 1 tablet (1 mg total) by mouth 3 (three) times daily. 90 tablet 2  . ipratropium (ATROVENT) 0.02 % nebulizer solution Take 500 mcg by nebulization every 6 (six) hours as needed for wheezing.     . ziprasidone (GEODON) 80 MG capsule Take 1 capsule (80 mg total) by mouth at bedtime. 30 capsule 3   No current facility-administered medications for this visit.    Previous Psychotropic Medications:  Medication Dose   Geodon   80 mg each bedtime   Cogentin   1 mg each bedtime    hydroxyzine   25 mg 3 times a day                Substance Abuse History in the last 12 months: Substance Age of 1st Use Last  Use Amount Specific Type  Nicotine      Alcohol      Cannabis      Opiates      Cocaine      Methamphetamines      LSD      Ecstasy      Benzodiazepines      Caffeine      Inhalants      Others:                          Medical Consequences of Substance Abuse: n/a  Legal Consequences of Substance Abuse:n/a  Family Consequences of Substance Abuse: n/a  Blackouts:  No DT's:  No Withdrawal Symptoms:  No None  Social History: Current Place of Residence: [place] of Birth:[place]  [place]  Family Members: Mother sister Marital Status:  Widowed Children: 6   Relationships: Claims to have an online relationship with a woman living in [place]  Education:  [employer] Graduate taking business courses online  Educational Problems/Performance:  Religious Beliefs/Practices: Christian  History of Abuse: none Pensions consultant; Dispensing optician, [employer], Electrical engineer History:  Dispensing optician History: Was arrested at age 33 for possession of stolen property and spent 18 months in the county jail  Hobbies/Interests: Computers   Family History:   Family History  Problem Relation Age of Onset  . Diabetes Father   . Alcohol abuse Father   .  Depression Mother     Mental Status Examination/Evaluation: Objective:  Appearance: Disheveled missing teeth, deviated right eye Malodorous and dirty   Eye Contact::  Good  Speech:  Clear and Coherent  Volume:  Normal  Mood:  slightly blunted but generally good   Affect:  Blunt, little irritable   Thought Process: Fairly clear   Orientation:  Full (Time, Place, and Person)  Thought Content: Denies auditory or visual hallucinations or paranoia or delusions   Suicidal Thoughts:  No  Homicidal Thoughts:  No  Judgement:  Impaired  Insight:  Lacking  Psychomotor Activity:  Normal  Akathisia:  No  Handed:  Right  AIMS (if indicated):    Assets:  Communication Skills Desire for Improvement    Laboratory/X-Ray Psychological Evaluation(s)   Basic metabolic panel, hemoglobin A1c and lipid panel      Assessment:  Axis I: Schizophrenia  AXIS I Schizophrenia  AXIS II Deferred  AXIS III Past Medical History  Diagnosis Date  . Schizophrenia ([facility])   . COPD (chronic obstructive pulmonary disease) ([facility])   . Amblyopia of right eye   . Blindness of right eye   . Hx of lead poisoning      AXIS IV other psychosocial or environmental problems  AXIS V 61-70 mild symptoms   Treatment Plan/Recommendations:  Plan of Care: Medication management   Laboratory:    Psychotherapy: He declined   Medications: he will continue Geodon 80 mg each bedtime for schizophrenia, and Cogentin 1 mg per day to prevent side effects from Geodon. He will start clonazepam 1 mg 3 times a day for anxiety and agitation   Routine PRN Medications:  No  Consultations:   Safety Concerns:  He denies thoughts of wanting to harm self or others   Other:    will return in 2 months     Levonne Spiller, MD 5/5/20171:25 PM

## 2015-07-27 ENCOUNTER — Ambulatory Visit (INDEPENDENT_AMBULATORY_CARE_PROVIDER_SITE_OTHER): Payer: 59 | Admitting: Psychiatry

## 2015-07-27 ENCOUNTER — Encounter (HOSPITAL_COMMUNITY): Payer: Self-pay | Admitting: Psychiatry

## 2015-07-27 VITALS — BP 110/90 | Ht 68.0 in | Wt 296.0 lb

## 2015-07-27 DIAGNOSIS — F200 Paranoid schizophrenia: Secondary | ICD-10-CM

## 2015-07-27 MED ORDER — CLONAZEPAM 1 MG PO TABS: 1.0000 mg | ORAL_TABLET | Freq: Three times a day (TID) | ORAL | Status: DC

## 2015-07-27 MED ORDER — BENZTROPINE MESYLATE 1 MG PO TABS: 1.0000 mg | ORAL_TABLET | Freq: Every day | ORAL | Status: DC

## 2015-07-27 MED ORDER — ZIPRASIDONE HCL 80 MG PO CAPS: 80.0000 mg | ORAL_CAPSULE | Freq: Every day | ORAL | Status: DC

## 2015-07-27 NOTE — Progress Notes (Signed)
Patient ID: Terry [name], male   DOB: 28-May-1955, 60 y.o.   MRN: [id number] Patient ID: Terry [name], male   DOB: Jan 28, 1955, 60 y.o.   MRN: [id number] Patient ID: Terry [name], male   DOB: 12-14-1955, 60 y.o.   MRN: [id number] Patient ID: Terry [name], male   DOB: 1955/09/09, 60 y.o.   MRN: [id number] Patient ID: Terry [name], male   DOB: 1955/02/03, 60 y.o.   MRN: [id number] Patient ID: Terry [name], male   DOB: [birth year], 60 y.o.   MRN: [id number] Patient ID: Terry [name], male   DOB: 1955-02-29, 60 y.o.   MRN: [id number] Patient ID: Terry [name], male   DOB: [birth year], 60 y.o.   MRN: [id number] Patient ID: Terry [name], male   DOB: 1955/09/24, 60 y.o.   MRN: [id number]  Psychiatric Assessment Adult  Patient Identification:  Terry [name] Date of Evaluation:  07/27/2015 Chief Complaint: I'm doing well " History of Chief Complaint:   Chief Complaint  Patient presents with  . Schizophrenia  . Follow-up    Anxiety Symptoms include confusion, nervous/anxious behavior and shortness of breath.     this patient is a 60 year old widowed black male who lives with his mother 29 year old son his niece and her 3 children in [place]. He is on disability for mental illness. He was referred by his mother who is also a patient here.  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 He did finish high school and went into the [employer] for 4 years. 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  About 10 years ago the patient's wife died of stomach cancer. After that he began to decompensate. He got depressed began hearing voices. He had ideas of reference like thinking .the TV and radio were talking to him. He was also quite paranoid. He was hospitalized in [place] for 6 weeks and diagnosed with schizophrenia he has been on Geodon and Cogentin ever since.  Recently  the patient has become increasingly anxious and had panic attacks. [facility] started him on hydroxyzine which has been helpful and he hasn't had any panic attacks in the last 6 weeks. He denies being currently depressed or hearing any voices or ideas of reference. He did make mention of some odd delusions such as thinking he has won the [employer].he was about to cancel his Medicaid insurance because he is now rich and I warned him that this is probably a scam . He seemed to accept my explanation. He claims he is sleeping well. He does have a history of problems with breathing and COPD. He denies other medical problems but I noted his blood glucose has been elevated the last several times. He is followed by Dr. Legrand Rams but this has not been brought to his attention.  The patient returns after 3 months. He stated he has been compliant with his clonazepam and Geodon and Cogentin. He seems to be in a good mood today but I don't know if he is telling the truth or delusional material. He states that he is going on a trip to a job that will be sponsored by the [employer]. He will then be moving back to [place] and they will be putting him up in a house in giving him  a car. 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 He is very pleasant. He is has no further breathing issues. Review of Systems  Constitutional: Negative.   HENT: Negative.   Eyes: Positive for visual disturbance.  Respiratory: Positive for shortness of breath.   Cardiovascular: Negative.   Gastrointestinal: Negative.   Endocrine: Negative.   Genitourinary: Negative.   Musculoskeletal: Negative.   Allergic/Immunologic: Negative.   Neurological: Negative.   Hematological: Negative.   Psychiatric/Behavioral: Positive for confusion. The patient is nervous/anxious.    Physical Exam not done   Depressive Symptoms: anxiety, panic attacks,  (Hypo) Manic Symptoms:   Elevated Mood:  No Irritable Mood:  No Grandiosity:  No Distractibility:  No Labiality of Mood:  No Delusions:  Yes Hallucinations:  Yes but not recently  Impulsivity:  No Sexually Inappropriate Behavior:  No Financial Extravagance:  No Flight of Ideas:  No  Anxiety Symptoms: Excessive Worry:  No Panic Symptoms:  Yes Agoraphobia:  No Obsessive Compulsive: No  Symptoms: None, Specific Phobias:  No Social Anxiety:  No  Psychotic Symptoms:  Hallucinations: Yes Auditory not recently Delusions:  Yes Paranoia:  Yes  not recently Ideas of Reference:  Yes not recently   PTSD Symptoms: Ever had a traumatic exposure:  No Had a traumatic exposure in the last month:  No Re-experiencing: No None Hypervigilance:  No Hyperarousal: No None Avoidance: No None  Traumatic Brain Injury: No   Past Psychiatric History: Diagnosis: Schizophrenia   Hospitalizations: hospitalized once or twice in [place]   Outpatient Care: in [place] and at [place]: none  Self-Mutilation: none  Suicidal Attempts: none  Violent Behaviors: none   Past Medical History:   Past Medical History  Diagnosis Date  . Schizophrenia ([place])   . COPD (chronic obstructive pulmonary disease) ([place])   . Amblyopia of right eye   . Blindness of right eye   . Hx of lead poisoning    History of Loss of Consciousness:  No Seizure History:  No Cardiac History:  No Allergies:  No Known Allergies Current Medications:  Current Outpatient Prescriptions  Medication Sig Dispense Refill  . albuterol (PROVENTIL HFA;VENTOLIN HFA) 108 (90 BASE) MCG/ACT inhaler Inhale 2 puffs into the lungs every 4 (four) hours as needed for wheezing or shortness of breath. 1 Inhaler 0  . benztropine (COGENTIN) 1 MG tablet Take 1 tablet (1 mg total) by mouth at bedtime. 30 tablet 3  . clonazePAM (KLONOPIN) 1 MG tablet Take 1 tablet (1 mg total)  by mouth 3 (three) times daily. 90 tablet 2  . ipratropium (ATROVENT) 0.02 % nebulizer solution Take 500 mcg by nebulization every 6 (six) hours as needed for wheezing.     . ziprasidone (GEODON) 80 MG capsule Take 1 capsule (80 mg total) by mouth at bedtime. 30 capsule 3   No current facility-administered medications for this visit.    Previous Psychotropic Medications:  Medication Dose   Geodon   80 mg each bedtime   Cogentin   1 mg each bedtime    hydroxyzine   25 mg 3 times a day                Substance Abuse History in the last 12 months: Substance Age of 1st Use Last Use Amount Specific Type  Nicotine      Alcohol      Cannabis  Opiates      Cocaine      Methamphetamines      LSD      Ecstasy      Benzodiazepines      Caffeine      Inhalants      Others:                          Medical Consequences of Substance Abuse: n/a  Legal Consequences of Substance Abuse:n/a  Family Consequences of Substance Abuse: n/a  Blackouts:  No DT's:  No Withdrawal Symptoms:  No None  Social History: Current Place of Residence: [place] of Birth:[place]  [place]  Family Members: Mother sister Marital Status:  Widowed Children: 6   Relationships: Claims to have an online relationship with a woman living in [place]  Education:  [employer] Graduate taking business courses online  Educational Problems/Performance:  Religious Beliefs/Practices: Christian  History of Abuse: none Pensions consultant; Dispensing optician, [employer], Electrical engineer History:  Dispensing optician History: Was arrested at age 26 for possession of stolen property and spent [place] months in the county jail  Hobbies/Interests: Computers   Family History:   Family History  Problem Relation Age of Onset  . Diabetes Father   . Alcohol abuse Father   . Depression Mother     Mental Status Examination/Evaluation: Objective:  Appearance: Disheveled missing teeth, deviated right  eye Malodorous   Eye Contact::  Good  Speech:  Clear and Coherent  Volume:  Normal  Mood:  slightly blunted but generally good   Affect:  Blunt,But brighter than usual   Thought Process: Fairly clear   Orientation:  Full (Time, Place, and Person)  Thought Content: Denies auditory or visual hallucinations or paranoia .Aims to harbor some grandiose delusions but he is pleasant   Suicidal Thoughts:  No  Homicidal Thoughts:  No  Judgement:  Impaired  Insight:  Lacking  Psychomotor Activity:  Normal  Akathisia:  No  Handed:  Right  AIMS (if indicated):    Assets:  Communication Skills Desire for Improvement    Laboratory/X-Ray Psychological Evaluation(s)   Basic metabolic panel, hemoglobin A1c and lipid panel      Assessment:  Axis I: Schizophrenia  AXIS I Schizophrenia  AXIS II Deferred  AXIS III Past Medical History  Diagnosis Date  . Schizophrenia ([place])   . COPD (chronic obstructive pulmonary disease) ([place])   . Amblyopia of right eye   . Blindness of right eye   . Hx of lead poisoning      AXIS IV other psychosocial or environmental problems  AXIS V 61-70 mild symptoms   Treatment Plan/Recommendations:  Plan of Care: Medication management   Laboratory:    Psychotherapy: He declined   Medications: he will continue Geodon 80 mg each bedtime for schizophrenia, and Cogentin 1 mg per day to prevent side effects from Geodon. He will Continue clonazepam 1 mg 3 times a day for anxiety and agitation   Routine PRN Medications:  No  Consultations:   Safety Concerns:  He denies thoughts of wanting to harm self or others   Other:    will return in 3 months     Levonne Spiller, MD 7/5/20172:09 PM

## 2015-08-12 ENCOUNTER — Ambulatory Visit (HOSPITAL_COMMUNITY): Payer: Self-pay | Admitting: Psychiatry

## 2015-10-27 ENCOUNTER — Encounter (HOSPITAL_COMMUNITY): Payer: Self-pay | Admitting: Psychiatry

## 2015-10-27 ENCOUNTER — Ambulatory Visit (INDEPENDENT_AMBULATORY_CARE_PROVIDER_SITE_OTHER): Payer: 59 | Admitting: Psychiatry

## 2015-10-27 VITALS — BP 124/85 | HR 105 | Ht 68.0 in | Wt 288.6 lb

## 2015-10-27 DIAGNOSIS — F200 Paranoid schizophrenia: Secondary | ICD-10-CM

## 2015-10-27 MED ORDER — ZIPRASIDONE HCL 80 MG PO CAPS: 80.0000 mg | ORAL_CAPSULE | Freq: Every day | ORAL | 3 refills | Status: DC

## 2015-10-27 MED ORDER — BENZTROPINE MESYLATE 1 MG PO TABS: 1.0000 mg | ORAL_TABLET | Freq: Every day | ORAL | 3 refills | Status: DC

## 2015-10-27 MED ORDER — CLONAZEPAM 1 MG PO TABS: 1.0000 mg | ORAL_TABLET | Freq: Three times a day (TID) | ORAL | 2 refills | Status: DC

## 2015-10-27 NOTE — Progress Notes (Signed)
Patient ID: Terry [name], male   DOB: 1955-05-20, 60 y.o.   MRN: [id number] Patient ID: Terry [name], male   DOB: Jul 17, 1955, 60 y.o.   MRN: [id number] Patient ID: Terry [name], male   DOB: [birth year], 60 y.o.   MRN: [id number] Patient ID: Terry [name], male   DOB: 1956-01-08, 60 y.o.   MRN: [id number] Patient ID: Terry [name], male   DOB: [birth year], 60 y.o.   MRN: [id number] Patient ID: Terry [name], male   DOB: 23-May-1955, 60 y.o.   MRN: [id number] Patient ID: Terry [name], male   DOB: 07/12/1955, 60 y.o.   MRN: [id number] Patient ID: Terry [name], male   DOB: Aug 21, 1955, 60 y.o.   MRN: [id number] Patient ID: Terry [name], male   DOB: 06-12-1955, 60 y.o.   MRN: [id number]  Psychiatric Assessment Adult  Patient Identification:  Terry [name] Date of Evaluation:  10/27/2015 Chief Complaint: I'm doing well " History of Chief Complaint:   Chief Complaint  Patient presents with  . Anxiety  . Schizophrenia  . Follow-up    Anxiety  Symptoms include confusion, nervous/anxious behavior and shortness of breath.     this patient is a 60 year old widowed black male who lives with his mother 68 year old son his niece and her 3 children in [place]. He is on disability for mental illness. He was referred by his mother who is also a patient here.  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 He did finish high school and went into the [employer] for 4 years. 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  About 10 years ago the patient's wife died of stomach cancer. After that he began to decompensate. He got depressed began hearing voices. He had ideas of reference like thinking .the TV and radio were talking to him. He was also quite paranoid. He was hospitalized in [place] for 6 weeks and diagnosed with schizophrenia he has been on Geodon and Cogentin ever  since.  Recently the patient has become increasingly anxious and had panic attacks. [facility] started him on hydroxyzine which has been helpful and he hasn't had any panic attacks in the last 6 weeks. He denies being currently depressed or hearing any voices or ideas of reference. He did make mention of some odd delusions such as thinking he has won the [employer].he was about to cancel his Medicaid insurance because he is now rich and I warned him that this is probably a scam . He seemed to accept my explanation. He claims he is sleeping well. He does have a history of problems with breathing and COPD. He denies other medical problems but I noted his blood glucose has been elevated the last several times. He is followed by Dr. Legrand Rams but this has not been brought to his attention.  The patient returns after 3 months. He stated he has been compliant with his clonazepam and Geodon and Cogentin. He still maintains that he is going on a trip to [place] to join a [employer] up there and it's unclear whether this is true or delusional. He seems to begin a good mood. He denies auditory or visual hallucinations or paranoia. He is sleeping and eating well and starting a get more exercise. He denies any side  effects from medications Review of Systems  Constitutional: Negative.   HENT: Negative.   Eyes: Positive for visual disturbance.  Respiratory: Positive for shortness of breath.   Cardiovascular: Negative.   Gastrointestinal: Negative.   Endocrine: Negative.   Genitourinary: Negative.   Musculoskeletal: Negative.   Allergic/Immunologic: Negative.   Neurological: Negative.   Hematological: Negative.   Psychiatric/Behavioral: Positive for confusion. The patient is nervous/anxious.    Physical Exam not done  Depressive Symptoms: anxiety, panic attacks,  (Hypo) Manic Symptoms:   Elevated Mood:  No Irritable Mood:  No Grandiosity:  No Distractibility:   No Labiality of Mood:  No Delusions:  Yes Hallucinations:  Yes but not recently  Impulsivity:  No Sexually Inappropriate Behavior:  No Financial Extravagance:  No Flight of Ideas:  No  Anxiety Symptoms: Excessive Worry:  No Panic Symptoms:  Yes Agoraphobia:  No Obsessive Compulsive: No  Symptoms: None, Specific Phobias:  No Social Anxiety:  No  Psychotic Symptoms:  Hallucinations: Yes Auditory not recently Delusions:  Yes Paranoia:  Yes  not recently Ideas of Reference:  Yes not recently   PTSD Symptoms: Ever had a traumatic exposure:  No Had a traumatic exposure in the last month:  No Re-experiencing: No None Hypervigilance:  No Hyperarousal: No None Avoidance: No None  Traumatic Brain Injury: No   Past Psychiatric History: Diagnosis: Schizophrenia   Hospitalizations: hospitalized once or twice in [place]   Outpatient Care: in [place] and at [facility]: none  Self-Mutilation: none  Suicidal Attempts: none  Violent Behaviors: none   Past Medical History:   Past Medical History:  Diagnosis Date  . Amblyopia of right eye   . Blindness of right eye   . COPD (chronic obstructive pulmonary disease) ([place])   . Hx of lead poisoning   . Schizophrenia ([place])    History of Loss of Consciousness:  No Seizure History:  No Cardiac History:  No Allergies:  No Known Allergies Current Medications:  Current Outpatient Prescriptions  Medication Sig Dispense Refill  . albuterol (PROVENTIL HFA;VENTOLIN HFA) 108 (90 BASE) MCG/ACT inhaler Inhale 2 puffs into the lungs every 4 (four) hours as needed for wheezing or shortness of breath. 1 Inhaler 0  . benztropine (COGENTIN) 1 MG tablet Take 1 tablet (1 mg total) by mouth at bedtime. 30 tablet 3  . clonazePAM (KLONOPIN) 1 MG tablet Take 1 tablet (1 mg total) by mouth 3 (three) times daily. 90 tablet 2  . ipratropium (ATROVENT) 0.02 % nebulizer solution Take 500 mcg by nebulization every 6 (six) hours as  needed for wheezing.     . ziprasidone (GEODON) 80 MG capsule Take 1 capsule (80 mg total) by mouth at bedtime. 30 capsule 3   No current facility-administered medications for this visit.     Previous Psychotropic Medications:  Medication Dose   Geodon   80 mg each bedtime   Cogentin   1 mg each bedtime    hydroxyzine   25 mg 3 times a day                Substance Abuse History in the last 12 months: Substance Age of 1st Use Last Use Amount Specific Type  Nicotine      Alcohol      Cannabis      Opiates      Cocaine      Methamphetamines      LSD      Ecstasy  Benzodiazepines      Caffeine      Inhalants      Others:                          Medical Consequences of Substance Abuse: n/a  Legal Consequences of Substance Abuse:n/a  Family Consequences of Substance Abuse: n/a  Blackouts:  No DT's:  No Withdrawal Symptoms:  No None  Social History: Current Place of Residence: [place] of Birth:[place]  [place]  Family Members: Mother sister Marital Status:  Widowed Children: 6   Relationships: Claims to have an online relationship with a woman living in [place]  Education:  [employer] Graduate taking business courses online  Educational Problems/Performance:  Religious Beliefs/Practices: Christian  History of Abuse: none Pensions consultant; Dispensing optician, Land, Electrical engineer History:  Dispensing optician History: Was arrested at age 18 for possession of stolen property and spent 18 months in the county jail  Hobbies/Interests: Computers   Family History:   Family History  Problem Relation Age of Onset  . Diabetes Father   . Alcohol abuse Father   . Depression Mother     Mental Status Examination/Evaluation: Objective:  Appearance: Disheveled missing teeth, deviated right eye MalodorousDirty clothing   Eye Contact::  Good  Speech:  Clear and Coherent  Volume:  Normal  Mood:  slightly blunted but generally good    Affect:  Blunt,But brighter than usual   Thought Process: Fairly clear   Orientation:  Full (Time, Place, and Person)  Thought Content: Denies auditory or visual hallucinations or paranoia .Seems to harbor some grandiose delusions but he is pleasant   Suicidal Thoughts:  No  Homicidal Thoughts:  No  Judgement:  Impaired  Insight:  Lacking  Psychomotor Activity:  Normal  Akathisia:  No  Handed:  Right  AIMS (if indicated):    Assets:  Communication Skills Desire for Improvement    Laboratory/X-Ray Psychological Evaluation(s)   Basic metabolic panel, hemoglobin A1c and lipid panel      Assessment:  Axis I: Schizophrenia  AXIS I Schizophrenia  AXIS II Deferred  AXIS III Past Medical History:  Diagnosis Date  . Amblyopia of right eye   . Blindness of right eye   . COPD (chronic obstructive pulmonary disease) ([facility])   . Hx of lead poisoning   . Schizophrenia ([place])      AXIS IV other psychosocial or environmental problems  AXIS V 61-70 mild symptoms   Treatment Plan/Recommendations:  Plan of Care: Medication management   Laboratory:    Psychotherapy: He declined   Medications: he will continue Geodon 80 mg each bedtime for schizophrenia, and Cogentin 1 mg per day to prevent side effects from Geodon. He will Continue clonazepam 1 mg 3 times a day for anxiety and agitation   Routine PRN Medications:  No  Consultations:   Safety Concerns:  He denies thoughts of wanting to harm self or others   Other:    will return in 3 months     Benjermin Korber, Neoma Laming, MD 10/5/20179:14 AM

## 2016-01-27 ENCOUNTER — Ambulatory Visit (HOSPITAL_COMMUNITY): Payer: Self-pay | Admitting: Psychiatry

## 2016-02-27 ENCOUNTER — Encounter (HOSPITAL_COMMUNITY): Payer: Self-pay

## 2016-02-27 ENCOUNTER — Ambulatory Visit (HOSPITAL_COMMUNITY): Payer: Medicaid Other | Admitting: Psychiatry

## 2016-03-07 ENCOUNTER — Encounter (HOSPITAL_COMMUNITY): Payer: Self-pay | Admitting: Psychiatry

## 2016-03-07 ENCOUNTER — Ambulatory Visit (INDEPENDENT_AMBULATORY_CARE_PROVIDER_SITE_OTHER): Payer: 59 | Admitting: Psychiatry

## 2016-03-07 VITALS — BP 121/75 | HR 75 | Ht 68.0 in | Wt 272.2 lb

## 2016-03-07 DIAGNOSIS — Z818 Family history of other mental and behavioral disorders: Secondary | ICD-10-CM

## 2016-03-07 DIAGNOSIS — Z811 Family history of alcohol abuse and dependence: Secondary | ICD-10-CM

## 2016-03-07 DIAGNOSIS — F200 Paranoid schizophrenia: Secondary | ICD-10-CM

## 2016-03-07 DIAGNOSIS — Z79899 Other long term (current) drug therapy: Secondary | ICD-10-CM | POA: Diagnosis not present

## 2016-03-07 DIAGNOSIS — Z833 Family history of diabetes mellitus: Secondary | ICD-10-CM | POA: Diagnosis not present

## 2016-03-07 MED ORDER — ZIPRASIDONE HCL 80 MG PO CAPS: 80.0000 mg | ORAL_CAPSULE | Freq: Every day | ORAL | 3 refills | Status: DC

## 2016-03-07 MED ORDER — CLONAZEPAM 1 MG PO TABS: 1.0000 mg | ORAL_TABLET | Freq: Three times a day (TID) | ORAL | 2 refills | Status: DC

## 2016-03-07 MED ORDER — BENZTROPINE MESYLATE 1 MG PO TABS: 1.0000 mg | ORAL_TABLET | Freq: Every day | ORAL | 3 refills | Status: DC

## 2016-03-07 NOTE — Progress Notes (Signed)
Patient ID: [name], male   DOB: [birth year], 61 y.o.   MRN: [id number] Patient ID: AKONI MORRISSON, male   DOB: December 31, 1955, 61 y.o.   MRN: [id number] Patient ID: CYNTHIA CHALLENDER, male   DOB: 1955/03/12, 61 y.o.   MRN: [id number] Patient ID: AJIT YOHEY, male   DOB: August 12, 1955, 61 y.o.   MRN: [id number] Patient ID: FRANKLIN BRESSAN, male   DOB: [birth year], 61 y.o.   MRN: [id number] Patient ID: LATAVIOUS PERSONIUS, male   DOB: 03/22/1955, 61 y.o.   MRN: [id number] Patient ID: DAILEY DORER, male   DOB: 1955-02-04, 61 y.o.   MRN: [id number] Patient ID: REDDINGTON GRIECO, male   DOB: May 02, 1955, 61 y.o.   MRN: [id number] Patient ID: HAMIN MONTOUR, male   DOB: [birth year], 61 y.o.   MRN: [id number]  Psychiatric Assessment Adult  Patient Identification:  YIOVANNI GARTMAN Date of Evaluation:  03/07/2016 Chief Complaint: I'm doing well " History of Chief Complaint:   Chief Complaint  Patient presents with  . Schizophrenia    Anxiety  Symptoms include confusion, nervous/anxious behavior and shortness of breath.     this patient is a 61 year old widowed black male who lives with his mother 61 year old son his niece and her 3 children in [place]. He is on disability for mental illness. He was referred by his mother who is also a patient here.  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 He did finish high school and went into the [employer] for 4 years. 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  About 10 years ago the patient's wife died of stomach cancer. After that he began to decompensate. He got depressed began hearing voices. He had ideas of reference like thinking .the TV and radio were talking to him. He was also quite paranoid. He was hospitalized in [place] for 6 weeks and diagnosed with schizophrenia he has been on Geodon and Cogentin ever since.  Recently the patient  has become increasingly anxious and had panic attacks. [facility] started him on hydroxyzine which has been helpful and he hasn't had any panic attacks in the last 6 weeks. He denies being currently depressed or hearing any voices or ideas of reference. He did make mention of some odd delusions such as thinking he has won the [employer].he was about to cancel his Medicaid insurance because he is now rich and I warned him that this is probably a scam . He seemed to accept my explanation. He claims he is sleeping well. He does have a history of problems with breathing and COPD. He denies other medical problems but I noted his blood glucose has been elevated the last several times. He is followed by Dr. Legrand Rams but this has not been brought to his attention.  The patient returns after 4 months. He stated he has been compliant with his clonazepam and Geodon and Cogentin. He he states that he spends his time reading eating and sleeping and doesn't have any specific complaints. He denies auditory or visual hallucinations. He denies being depressed or anxious and the Klonopin has helped his panic attacks he denies paranoia or any delusional thoughts today. He has lost about 20 pounds and he is not sure why but claims he has kept up with his  visits with his primary physician Dr. Legrand Rams. Review of Systems  Constitutional: Negative.   HENT: Negative.   Eyes: Positive for visual disturbance.  Respiratory: Positive for shortness of breath.   Cardiovascular: Negative.   Gastrointestinal: Negative.   Endocrine: Negative.   Genitourinary: Negative.   Musculoskeletal: Negative.   Allergic/Immunologic: Negative.   Neurological: Negative.   Hematological: Negative.   Psychiatric/Behavioral: Positive for confusion. The patient is nervous/anxious.    Physical Exam not done  Depressive Symptoms: anxiety, panic attacks,  (Hypo) Manic Symptoms:   Elevated Mood:  No Irritable  Mood:  No Grandiosity:  No Distractibility:  No Labiality of Mood:  No Delusions:  Yes Hallucinations:  Yes but not recently  Impulsivity:  No Sexually Inappropriate Behavior:  No Financial Extravagance:  No Flight of Ideas:  No  Anxiety Symptoms: Excessive Worry:  No Panic Symptoms:  Yes Agoraphobia:  No Obsessive Compulsive: No  Symptoms: None, Specific Phobias:  No Social Anxiety:  No  Psychotic Symptoms:  Hallucinations: Yes Auditory not recently Delusions:  Yes Paranoia:  Yes  not recently Ideas of Reference:  Yes not recently   PTSD Symptoms: Ever had a traumatic exposure:  No Had a traumatic exposure in the last month:  No Re-experiencing: No None Hypervigilance:  No Hyperarousal: No None Avoidance: No None  Traumatic Brain Injury: No   Past Psychiatric History: Diagnosis: Schizophrenia   Hospitalizations: hospitalized once or twice in [place]   Outpatient Care: in [place] and at [facility]: none  Self-Mutilation: none  Suicidal Attempts: none  Violent Behaviors: none   Past Medical History:   Past Medical History:  Diagnosis Date  . Amblyopia of right eye   . Blindness of right eye   . COPD (chronic obstructive pulmonary disease) ([place])   . Hx of lead poisoning   . Schizophrenia ([place])    History of Loss of Consciousness:  No Seizure History:  No Cardiac History:  No Allergies:  No Known Allergies Current Medications:  Current Outpatient Prescriptions  Medication Sig Dispense Refill  . albuterol (PROVENTIL HFA;VENTOLIN HFA) 108 (90 BASE) MCG/ACT inhaler Inhale 2 puffs into the lungs every 4 (four) hours as needed for wheezing or shortness of breath. 1 Inhaler 0  . benztropine (COGENTIN) 1 MG tablet Take 1 tablet (1 mg total) by mouth at bedtime. 30 tablet 3  . clonazePAM (KLONOPIN) 1 MG tablet Take 1 tablet (1 mg total) by mouth 3 (three) times daily. 90 tablet 2  . ipratropium (ATROVENT) 0.02 % nebulizer solution Take 500  mcg by nebulization every 6 (six) hours as needed for wheezing.     . ziprasidone (GEODON) 80 MG capsule Take 1 capsule (80 mg total) by mouth at bedtime. 30 capsule 3   No current facility-administered medications for this visit.     Previous Psychotropic Medications:  Medication Dose   Geodon   80 mg each bedtime   Cogentin   1 mg each bedtime    hydroxyzine   25 mg 3 times a day                Substance Abuse History in the last 12 months: Substance Age of 1st Use Last Use Amount Specific Type  Nicotine      Alcohol      Cannabis      Opiates      Cocaine      Methamphetamines      LSD      Ecstasy  Benzodiazepines      Caffeine      Inhalants      Others:                          Medical Consequences of Substance Abuse: n/a  Legal Consequences of Substance Abuse:n/a  Family Consequences of Substance Abuse: n/a  Blackouts:  No DT's:  No Withdrawal Symptoms:  No None  Social History: Current Place of Residence: [place] of Birth:[place]  [place]  Family Members: Mother sister Marital Status:  Widowed Children: 6   Relationships: Claims to have an online relationship with a woman living in [place]  Education:  [employer] Graduate taking business courses online  Educational Problems/Performance:  Religious Beliefs/Practices: Christian  History of Abuse: none Pensions consultant; Dispensing optician, Land, Electrical engineer History:  Dispensing optician History: Was arrested at age 72 for possession of stolen property and spent 18 months in the county jail  Hobbies/Interests: Computers   Family History:   Family History  Problem Relation Age of Onset  . Diabetes Father   . Alcohol abuse Father   . Depression Mother     Mental Status Examination/Evaluation: Objective:  Appearance: Disheveled missing teeth, deviated right eye MalodorousDirty clothing   Eye Contact::  Good  Speech:  Clear and Coherent  Volume:  Normal   Mood:  slightly blunted but generally good   Affect:  Blunt,  Thought Process: Fairly clear   Orientation:  Full (Time, Place, and Person)  Thought Content: Denies auditory or visual hallucinations or paranoia .  Suicidal Thoughts:  No  Homicidal Thoughts:  No  Judgement:  Impaired  Insight:  Lacking  Psychomotor Activity:  Normal  Akathisia:  No  Handed:  Right  AIMS (if indicated):    Assets:  Communication Skills Desire for Improvement    Laboratory/X-Ray Psychological Evaluation(s)   Basic metabolic panel, hemoglobin A1c and lipid panel      Assessment:  Axis I: Schizophrenia  AXIS I Schizophrenia  AXIS II Deferred  AXIS III Past Medical History:  Diagnosis Date  . Amblyopia of right eye   . Blindness of right eye   . COPD (chronic obstructive pulmonary disease) ([place])   . Hx of lead poisoning   . Schizophrenia ([place])      AXIS IV other psychosocial or environmental problems  AXIS V 61-70 mild symptoms   Treatment Plan/Recommendations:  Plan of Care: Medication management   Laboratory:    Psychotherapy: He declined   Medications: he will continue Geodon 80 mg each bedtime for schizophrenia, and Cogentin 1 mg per day to prevent side effects from Geodon. He will Continue clonazepam 1 mg 3 times a day for anxiety and agitation   Routine PRN Medications:  No  Consultations:   Safety Concerns:  He denies thoughts of wanting to harm self or others   Other:    will return in 3 months     Levonne Spiller, MD 2/14/20188:31 AM

## 2016-03-23 ENCOUNTER — Other Ambulatory Visit (HOSPITAL_COMMUNITY): Payer: Self-pay | Admitting: Psychiatry

## 2016-05-25 DIAGNOSIS — J449 Chronic obstructive pulmonary disease, unspecified: Secondary | ICD-10-CM | POA: Diagnosis not present

## 2016-05-25 DIAGNOSIS — Z1389 Encounter for screening for other disorder: Secondary | ICD-10-CM | POA: Diagnosis not present

## 2016-05-25 DIAGNOSIS — Z0189 Encounter for other specified special examinations: Secondary | ICD-10-CM | POA: Diagnosis not present

## 2016-05-25 DIAGNOSIS — M79604 Pain in right leg: Secondary | ICD-10-CM | POA: Diagnosis not present

## 2016-05-28 ENCOUNTER — Other Ambulatory Visit (HOSPITAL_COMMUNITY): Payer: Self-pay | Admitting: Internal Medicine

## 2016-05-28 ENCOUNTER — Ambulatory Visit (HOSPITAL_COMMUNITY)
Admission: RE | Admit: 2016-05-28 | Discharge: 2016-05-28 | Disposition: A | Discharge status: Home / Self Care | Payer: Medicare Other | Source: Ambulatory Visit | Attending: Internal Medicine | Admitting: Internal Medicine

## 2016-05-28 DIAGNOSIS — J449 Chronic obstructive pulmonary disease, unspecified: Secondary | ICD-10-CM | POA: Diagnosis not present

## 2016-05-28 DIAGNOSIS — M79604 Pain in right leg: Secondary | ICD-10-CM | POA: Diagnosis not present

## 2016-05-28 DIAGNOSIS — M25461 Effusion, right knee: Secondary | ICD-10-CM | POA: Insufficient documentation

## 2016-05-28 DIAGNOSIS — M25561 Pain in right knee: Secondary | ICD-10-CM | POA: Diagnosis not present

## 2016-05-28 DIAGNOSIS — J410 Simple chronic bronchitis: Secondary | ICD-10-CM

## 2016-05-28 DIAGNOSIS — R609 Edema, unspecified: Secondary | ICD-10-CM

## 2016-05-28 DIAGNOSIS — R52 Pain, unspecified: Secondary | ICD-10-CM

## 2016-05-28 DIAGNOSIS — M7989 Other specified soft tissue disorders: Secondary | ICD-10-CM | POA: Diagnosis not present

## 2016-05-31 ENCOUNTER — Emergency Department (HOSPITAL_COMMUNITY)
Admission: EM | Admit: 2016-05-31 | Discharge: 2016-05-31 | Disposition: A | Discharge status: Home / Self Care | Payer: Medicare Other | Attending: Emergency Medicine | Admitting: Emergency Medicine

## 2016-05-31 ENCOUNTER — Ambulatory Visit (HOSPITAL_COMMUNITY): Payer: Medicaid Other | Admitting: Psychiatry

## 2016-05-31 ENCOUNTER — Encounter (HOSPITAL_COMMUNITY): Payer: Self-pay | Admitting: Emergency Medicine

## 2016-05-31 DIAGNOSIS — R0682 Tachypnea, not elsewhere classified: Secondary | ICD-10-CM | POA: Diagnosis not present

## 2016-05-31 DIAGNOSIS — Z79899 Other long term (current) drug therapy: Secondary | ICD-10-CM | POA: Insufficient documentation

## 2016-05-31 DIAGNOSIS — F410 Panic disorder [episodic paroxysmal anxiety] without agoraphobia: Secondary | ICD-10-CM | POA: Diagnosis present

## 2016-05-31 DIAGNOSIS — J449 Chronic obstructive pulmonary disease, unspecified: Secondary | ICD-10-CM | POA: Diagnosis not present

## 2016-05-31 DIAGNOSIS — Z760 Encounter for issue of repeat prescription: Secondary | ICD-10-CM

## 2016-05-31 DIAGNOSIS — F419 Anxiety disorder, unspecified: Secondary | ICD-10-CM | POA: Diagnosis not present

## 2016-05-31 DIAGNOSIS — I482 Chronic atrial fibrillation: Secondary | ICD-10-CM | POA: Diagnosis not present

## 2016-05-31 DIAGNOSIS — Z87891 Personal history of nicotine dependence: Secondary | ICD-10-CM | POA: Diagnosis not present

## 2016-05-31 HISTORY — DX: Panic disorder (episodic paroxysmal anxiety): F41.0

## 2016-05-31 MED ORDER — HYDROXYZINE HCL 25 MG PO TABS: 25.0000 mg | ORAL_TABLET | Freq: Four times a day (QID) | ORAL | 0 refills | Status: DC | PRN

## 2016-05-31 MED ORDER — HYDROXYZINE HCL 25 MG PO TABS
25.0000 mg | ORAL_TABLET | Freq: Once | ORAL | Status: AC
  Administered 2016-05-31: 25 mg via ORAL
  Filled 2016-05-31: qty 1

## 2016-05-31 NOTE — ED Triage Notes (Signed)
Pt arrived by EMS from home, called out for SOB. Family states he has "attacks" & was not able to breath. Per EMS pt had 98% on room air.

## 2016-05-31 NOTE — ED Notes (Signed)
Pt alert & oriented x4, stable gait. Patient  given discharge instructions, paperwork & prescription(s). Patient verbalized understanding. Pt left department w/ no further questions. 

## 2016-05-31 NOTE — Discharge Instructions (Signed)
Take the prescription as directed.  Call your regular medical doctor tomorrow to schedule a follow up appointment within the next 2 days. Call your mental health provider tomorrow to schedule a follow up appointment within the next week.  Return to the Emergency Department immediately sooner if worsening.

## 2016-05-31 NOTE — ED Triage Notes (Signed)
Pt says he has not had anxiety pill in a week that he is out.
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
## 2016-06-01 ENCOUNTER — Encounter (HOSPITAL_COMMUNITY): Payer: Self-pay | Admitting: Psychiatry

## 2016-06-01 ENCOUNTER — Ambulatory Visit (INDEPENDENT_AMBULATORY_CARE_PROVIDER_SITE_OTHER): Payer: 59 | Admitting: Psychiatry

## 2016-06-01 VITALS — BP 110/80 | Ht 68.0 in | Wt 260.0 lb

## 2016-06-01 DIAGNOSIS — Z79899 Other long term (current) drug therapy: Secondary | ICD-10-CM | POA: Diagnosis not present

## 2016-06-01 DIAGNOSIS — F200 Paranoid schizophrenia: Secondary | ICD-10-CM | POA: Diagnosis not present

## 2016-06-01 DIAGNOSIS — Z811 Family history of alcohol abuse and dependence: Secondary | ICD-10-CM

## 2016-06-01 DIAGNOSIS — Z818 Family history of other mental and behavioral disorders: Secondary | ICD-10-CM | POA: Diagnosis not present

## 2016-06-01 MED ORDER — BENZTROPINE MESYLATE 1 MG PO TABS: 1.0000 mg | ORAL_TABLET | Freq: Every day | ORAL | 3 refills | Status: DC

## 2016-06-01 MED ORDER — CLONAZEPAM 1 MG PO TABS: 1.0000 mg | ORAL_TABLET | Freq: Three times a day (TID) | ORAL | 2 refills | Status: DC

## 2016-06-01 MED ORDER — ZIPRASIDONE HCL 80 MG PO CAPS: 80.0000 mg | ORAL_CAPSULE | Freq: Every day | ORAL | 3 refills | Status: DC

## 2016-06-01 NOTE — Progress Notes (Signed)
Patient ID: Terry [name], male   DOB: 1955-09-26, 61 y.o.   MRN: [id number] Patient ID: Terry [name], male   DOB: December 09, 1955, 61 y.o.   MRN: [id number] Patient ID: Terry [name], male   DOB: [birth year], 61 y.o.   MRN: [id number] Patient ID: Terry [name], male   DOB: Sep 22, 1955, 61 y.o.   MRN: [id number] Patient ID: Terry [name], male   DOB: 1955/04/10, 61 y.o.   MRN: [id number] Patient ID: Terry [name], male   DOB: [birth year], 61 y.o.   MRN: [id number] Patient ID: Terry [name], male   DOB: November 23, 1955, 61 y.o.   MRN: [id number] Patient ID: Terry [name], male   DOB: January 13, 1956, 61 y.o.   MRN: [id number] Patient ID: Terry [name], male   DOB: [birth year], 61 y.o.   MRN: [id number]  Psychiatric Assessment Adult  Patient Identification:  Terry [name] Date of Evaluation:  06/01/2016 Chief Complaint: I'm doing well " History of Chief Complaint:   Chief Complaint  Patient presents with  . Schizophrenia  . Anxiety  . Follow-up    Anxiety  Symptoms include confusion, nervous/anxious behavior and shortness of breath.     this patient is a 61 year old widowed black male who lives with his mother 10 year old son his niece and her 3 children in [place]. He is on disability for mental illness. He was referred by his mother who is also a patient here.  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 He did finish high school and went into the [employer] for 4 years. 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  About 10 years ago the patient's wife died of stomach cancer. After that he began to decompensate. He got depressed began hearing voices. He had ideas of reference like thinking .the TV and radio were talking to him. He was also quite paranoid. He was hospitalized in [place] for 6 weeks and diagnosed with schizophrenia he has been on Geodon and Cogentin ever  since.  Recently the patient has become increasingly anxious and had panic attacks. [facility] started him on hydroxyzine which has been helpful and he hasn't had any panic attacks in the last 6 weeks. He denies being currently depressed or hearing any voices or ideas of reference. He did make mention of some odd delusions such as thinking he has won the [employer].he was about to cancel his Medicaid insurance because he is now rich and I warned him that this is probably a scam . He seemed to accept my explanation. He claims he is sleeping well. He does have a history of problems with breathing and COPD. He denies other medical problems but I noted his blood glucose has been elevated the last several times. He is followed by Dr. Legrand Rams but this has not been brought to his attention.  The patient returns after 3 months. He was seen in the emergency room last night because he had a panic attack. He was supposed to come to the office yesterday and missed his appointment. He also claims that he hasn't picked up his refills and ran out of all of his medications about 2 weeks ago. He denies auditory or visual hallucinations or paranoia. He denies suicidal or homicidal ideation but he has been a lot more anxious. He  states that he used his money to get a new cable TV system rather than spent on medicines even though his medicines only cost $3 each. I explained to him that unless he kept up with medications he would end up in the hospital and we really don't want to see that happen. He voices agreement. He was given all refills today Review of Systems  Constitutional: Negative.   HENT: Negative.   Eyes: Positive for visual disturbance.  Respiratory: Positive for shortness of breath.   Cardiovascular: Negative.   Gastrointestinal: Negative.   Endocrine: Negative.   Genitourinary: Negative.   Musculoskeletal: Negative.   Allergic/Immunologic: Negative.   Neurological:  Negative.   Hematological: Negative.   Psychiatric/Behavioral: Positive for confusion. The patient is nervous/anxious.    Physical Exam not done  Depressive Symptoms: anxiety, panic attacks,  (Hypo) Manic Symptoms:   Elevated Mood:  No Irritable Mood:  No Grandiosity:  No Distractibility:  No Labiality of Mood:  No Delusions:  Yes Hallucinations:  Yes but not recently  Impulsivity:  No Sexually Inappropriate Behavior:  No Financial Extravagance:  No Flight of Ideas:  No  Anxiety Symptoms: Excessive Worry:  No Panic Symptoms:  Yes Agoraphobia:  No Obsessive Compulsive: No  Symptoms: None, Specific Phobias:  No Social Anxiety:  No  Psychotic Symptoms:  Hallucinations: Yes Auditory not recently Delusions:  Yes Paranoia:  Yes  not recently Ideas of Reference:  Yes not recently   PTSD Symptoms: Ever had a traumatic exposure:  No Had a traumatic exposure in the last month:  No Re-experiencing: No None Hypervigilance:  No Hyperarousal: No None Avoidance: No None  Traumatic Brain Injury: No   Past Psychiatric History: Diagnosis: Schizophrenia   Hospitalizations: hospitalized once or twice in [place]   Outpatient Care: in [place] and at [place]: none  Self-Mutilation: none  Suicidal Attempts: none  Violent Behaviors: none   Past Medical History:   Past Medical History:  Diagnosis Date  . Amblyopia of right eye   . Blindness of right eye   . COPD (chronic obstructive pulmonary disease) ([place])   . Hx of lead poisoning   . Panic attack   . Schizophrenia ([place])    History of Loss of Consciousness:  No Seizure History:  No Cardiac History:  No Allergies:  No Known Allergies Current Medications:  Current Outpatient Prescriptions  Medication Sig Dispense Refill  . albuterol (PROVENTIL HFA;VENTOLIN HFA) 108 (90 BASE) MCG/ACT inhaler Inhale 2 puffs into the lungs every 4 (four) hours as needed for wheezing or shortness of breath. 1  Inhaler 0  . benztropine (COGENTIN) 1 MG tablet Take 1 tablet (1 mg total) by mouth at bedtime. 30 tablet 3  . clonazePAM (KLONOPIN) 1 MG tablet Take 1 tablet (1 mg total) by mouth 3 (three) times daily. 90 tablet 2  . hydrOXYzine (ATARAX/VISTARIL) 25 MG tablet Take 1 tablet (25 mg total) by mouth every 6 (six) hours as needed for anxiety. 12 tablet 0  . ipratropium (ATROVENT) 0.02 % nebulizer solution Take 500 mcg by nebulization every 6 (six) hours as needed for wheezing.     . ziprasidone (GEODON) 80 MG capsule Take 1 capsule (80 mg total) by mouth at bedtime. 30 capsule 3   No current facility-administered medications for this visit.     Previous Psychotropic Medications:  Medication Dose   Geodon   80 mg each bedtime   Cogentin   1 mg each bedtime    hydroxyzine  25 mg 3 times a day                Substance Abuse History in the last 12 months: Substance Age of 1st Use Last Use Amount Specific Type  Nicotine      Alcohol      Cannabis      Opiates      Cocaine      Methamphetamines      LSD      Ecstasy      Benzodiazepines      Caffeine      Inhalants      Others:                          Medical Consequences of Substance Abuse: n/a  Legal Consequences of Substance Abuse:n/a  Family Consequences of Substance Abuse: n/a  Blackouts:  No DT's:  No Withdrawal Symptoms:  No None  Social History: Current Place of Residence: [place] Birth:[place]  [place]  Family Members: Mother sister Marital Status:  Widowed Children: 6   Relationships: Claims to have an online relationship with a woman living in [place]  Education:  [employer] Graduate taking business courses online  Educational Problems/Performance:  Religious Beliefs/Practices: Christian  History of Abuse: none Pensions consultant; Dispensing optician, [employer], Electrical engineer History:  Dispensing optician History: Was arrested at age 62 for possession of stolen property and  sp[place] in the [place] jail  Hobbies/Interests: Computers   Family History:   Family History  Problem Relation Age of Onset  . Depression Mother   . Diabetes Father   . Alcohol abuse Father     Mental Status Examination/Evaluation: Objective:  Appearance: Disheveled missing teeth, deviated right eye MalodorousDirty clothing   Eye Contact::  Good  Speech:  Clear and Coherent  Volume:  Normal  Mood:  slightly blunted but generally good   Affect:  Blunt,  Thought Process: Fairly clear   Orientation:  Full (Time, Place, and Person)  Thought Content: Denies auditory or visual hallucinations or paranoia .  Suicidal Thoughts:  No  Homicidal Thoughts:  No  Judgement:  Impaired  Insight:  Lacking  Psychomotor Activity:  Normal  Akathisia:  No  Handed:  Right  AIMS (if indicated):    Assets:  Communication Skills Desire for Improvement    Laboratory/X-Ray Psychological Evaluation(s)   Basic metabolic panel, hemoglobin A1c and lipid panel      Assessment:  Axis I: Schizophrenia  AXIS I Schizophrenia  AXIS II Deferred  AXIS III Past Medical History:  Diagnosis Date  . Amblyopia of right eye   . Blindness of right eye   . COPD (chronic obstructive pulmonary disease) ([place])   . Hx of lead poisoning   . Panic attack   . Schizophrenia ([place])      AXIS IV other psychosocial or environmental problems  AXIS V 61-70 mild symptoms   Treatment Plan/Recommendations:  Plan of Care: Medication management   Laboratory:    Psychotherapy: He declined   Medications: he will continue Geodon 80 mg each bedtime for schizophrenia, and Cogentin 1 mg per day to prevent side effects from Geodon. He will Continue clonazepam 1 mg 3 times a day for anxiety and agitation.He has agreed to be more compliant with medication or to let us know if he runs out of anything   Routine PRN Medications:  No  Consultations:   Safety Concerns:  He  denies thoughts of wanting to harm self or others    Other:    will return in 3 months     Levonne Spiller, MD 5/11/201810:03 AM

## 2016-06-07 ENCOUNTER — Ambulatory Visit: Payer: Medicare Other | Admitting: Orthopaedic Surgery

## 2016-06-12 ENCOUNTER — Ambulatory Visit: Payer: Medicare Other | Admitting: Orthopaedic Surgery

## 2016-08-31 ENCOUNTER — Encounter (HOSPITAL_COMMUNITY): Payer: Self-pay | Admitting: Psychiatry

## 2016-08-31 ENCOUNTER — Ambulatory Visit (INDEPENDENT_AMBULATORY_CARE_PROVIDER_SITE_OTHER): Payer: Medicare Other | Admitting: Psychiatry

## 2016-08-31 VITALS — BP 121/75 | HR 86 | Ht 68.0 in | Wt 264.0 lb

## 2016-08-31 DIAGNOSIS — F79 Unspecified intellectual disabilities: Secondary | ICD-10-CM

## 2016-08-31 DIAGNOSIS — F410 Panic disorder [episodic paroxysmal anxiety] without agoraphobia: Secondary | ICD-10-CM | POA: Diagnosis not present

## 2016-08-31 DIAGNOSIS — F419 Anxiety disorder, unspecified: Secondary | ICD-10-CM | POA: Diagnosis not present

## 2016-08-31 DIAGNOSIS — Z811 Family history of alcohol abuse and dependence: Secondary | ICD-10-CM | POA: Diagnosis not present

## 2016-08-31 DIAGNOSIS — Z634 Disappearance and death of family member: Secondary | ICD-10-CM | POA: Diagnosis not present

## 2016-08-31 DIAGNOSIS — F209 Schizophrenia, unspecified: Secondary | ICD-10-CM

## 2016-08-31 DIAGNOSIS — Z818 Family history of other mental and behavioral disorders: Secondary | ICD-10-CM | POA: Diagnosis not present

## 2016-08-31 DIAGNOSIS — F200 Paranoid schizophrenia: Secondary | ICD-10-CM

## 2016-08-31 DIAGNOSIS — H547 Unspecified visual loss: Secondary | ICD-10-CM | POA: Diagnosis not present

## 2016-08-31 MED ORDER — ZIPRASIDONE HCL 80 MG PO CAPS: 80.0000 mg | ORAL_CAPSULE | Freq: Every day | ORAL | 3 refills | Status: DC

## 2016-08-31 MED ORDER — CLONAZEPAM 1 MG PO TABS: 1.0000 mg | ORAL_TABLET | Freq: Three times a day (TID) | ORAL | 2 refills | Status: DC

## 2016-08-31 MED ORDER — BENZTROPINE MESYLATE 1 MG PO TABS: 1.0000 mg | ORAL_TABLET | Freq: Every day | ORAL | 3 refills | Status: DC

## 2016-08-31 NOTE — Progress Notes (Signed)
Patient ID: Terry [name], male   DOB: 08/12/1955, 61 y.o.   MRN: [id number] Patient ID: Terry [name], male   DOB: 10-Jun-1955, 61 y.o.   MRN: [id number] Patient ID: Terry [name], male   DOB: June 26, 1955, 61 y.o.   MRN: [id number] Patient ID: Terry [name], male   DOB: Sep 02, 1955, 61 y.o.   MRN: [id number] Patient ID: Terry [name], male   DOB: 05/26/1955, 61 y.o.   MRN: [id number] Patient ID: Terry [name], male   DOB: 1955/10/03, 61 y.o.   MRN: [id number] Patient ID: Terry [name], male   DOB: November 30, 1955, 61 y.o.   MRN: [id number] Patient ID: Terry [name], male   DOB: [birth year], 61 y.o.   MRN: [id number] Patient ID: Terry [name], male   DOB: 12-07-1955, 61 y.o.   MRN: [id number]  Psychiatric Assessment Adult  Patient Identification:  Terry [name] Date of Evaluation:  08/31/2016 Chief Complaint: I'm doing well " History of Chief Complaint:   Chief Complaint  Patient presents with  . Schizophrenia  . Follow-up    Anxiety  Symptoms include confusion, nervous/anxious behavior and shortness of breath.     this patient is a 61 year-old widowed black male who lives with his mother And his 2 sons in [place]. He is on disability for mental illness. He was referred by his mother who is also a patient here.  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 He did finish high school and went into the [employer] for 4 years. 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  About 10 years ago the patient's wife died of stomach cancer. After that he began to decompensate. He got depressed began hearing voices. He had ideas of reference like thinking .the TV and radio were talking to him. He was also quite paranoid. He was hospitalized in [place] for 6 weeks and diagnosed with schizophrenia he has been on Geodon and Cogentin ever since.  Recently the patient has become  increasingly anxious and had panic attacks. [facility] started him on hydroxyzine which has been helpful and he hasn't had any panic attacks in the last 6 weeks. He denies being currently depressed or hearing any voices or ideas of reference. He did make mention of some odd delusions such as thinking he has won the [employer].he was about to cancel his Medicaid insurance because he is now rich and I warned him that this is probably a scam . He seemed to accept my explanation. He claims he is sleeping well. He does have a history of problems with breathing and COPD. He denies other medical problems but I noted his blood glucose has been elevated the last several times. He is followed by Dr. Legrand Rams but this has not been brought to his attention.  The patient returns after 3 months. He states he is doing well and has no specific complaints. He states that he's been compliant with all of his medicines. Last time he had chosen to buy TV equipment rather than pay for medication but he claims that he learned his lesson because he developed a lot of anxiety without them. He's spending time reading and relaxing at home. He denies auditory visualizations paranoia delusions depression or suicidal ideation. He's been walking a lot and has lost  about 15 pounds Review of Systems  Constitutional: Negative.   HENT: Negative.   Eyes: Positive for visual disturbance.  Respiratory: Positive for shortness of breath.   Cardiovascular: Negative.   Gastrointestinal: Negative.   Endocrine: Negative.   Genitourinary: Negative.   Musculoskeletal: Negative.   Allergic/Immunologic: Negative.   Neurological: Negative.   Hematological: Negative.   Psychiatric/Behavioral: Positive for confusion. The patient is nervous/anxious.    Physical Exam not done  Depressive Symptoms: anxiety, panic attacks,  (Hypo) Manic Symptoms:   Elevated Mood:  No Irritable Mood:  No Grandiosity:   No Distractibility:  No Labiality of Mood:  No Delusions:  Yes Hallucinations:  Yes but not recently  Impulsivity:  No Sexually Inappropriate Behavior:  No Financial Extravagance:  No Flight of Ideas:  No  Anxiety Symptoms: Excessive Worry:  No Panic Symptoms:  Yes Agoraphobia:  No Obsessive Compulsive: No  Symptoms: None, Specific Phobias:  No Social Anxiety:  No  Psychotic Symptoms:  Hallucinations: Yes Auditory not recently Delusions:  Yes Paranoia:  Yes  not recently Ideas of Reference:  Yes not recently   PTSD Symptoms: Ever had a traumatic exposure:  No Had a traumatic exposure in the last month:  No Re-experiencing: No None Hypervigilance:  No Hyperarousal: No None Avoidance: No None  Traumatic Brain Injury: No   Past Psychiatric History: Diagnosis: Schizophrenia   Hospitalizations: hospitalized once or twice in [place]   Outpatient Care: in [place] and at [place]: none  Self-Mutilation: none  Suicidal Attempts: none  Violent Behaviors: none   Past Medical History:   Past Medical History:  Diagnosis Date  . Amblyopia of right eye   . Blindness of right eye   . COPD (chronic obstructive pulmonary disease) ([place])   . Hx of lead poisoning   . Panic attack   . Schizophrenia ([place])    History of Loss of Consciousness:  No Seizure History:  No Cardiac History:  No Allergies:  No Known Allergies Current Medications:  Current Outpatient Prescriptions  Medication Sig Dispense Refill  . albuterol (PROVENTIL HFA;VENTOLIN HFA) 108 (90 BASE) MCG/ACT inhaler Inhale 2 puffs into the lungs every 4 (four) hours as needed for wheezing or shortness of breath. 1 Inhaler 0  . benztropine (COGENTIN) 1 MG tablet Take 1 tablet (1 mg total) by mouth at bedtime. 30 tablet 3  . clonazePAM (KLONOPIN) 1 MG tablet Take 1 tablet (1 mg total) by mouth 3 (three) times daily. 90 tablet 2  . ipratropium (ATROVENT) 0.02 % nebulizer solution Take 500 mcg by  nebulization every 6 (six) hours as needed for wheezing.     . SYMBICORT 160-4.5 MCG/ACT inhaler     . ziprasidone (GEODON) 80 MG capsule Take 1 capsule (80 mg total) by mouth at bedtime. 30 capsule 3   No current facility-administered medications for this visit.     Previous Psychotropic Medications:  Medication Dose   Geodon   80 mg each bedtime   Cogentin   1 mg each bedtime    hydroxyzine   25 mg 3 times a day                Substance Abuse History in the last 12 months: Substance Age of 1st Use Last Use Amount Specific Type  Nicotine      Alcohol      Cannabis      Opiates      Cocaine      Methamphetamines  LSD      Ecstasy      Benzodiazepines      Caffeine      Inhalants      Others:                          Medical Consequences of Substance Abuse: n/a  Legal Consequences of Substance Abuse:n/a  Family Consequences of Substance Abuse: n/a  Blackouts:  No DT's:  No Withdrawal Symptoms:  No None  Social History: Current Place of Residence: [place] of Birth:[place]  [place]  Family Members: Mother sister Marital Status:  Widowed Children: 6   Relationships: Claims to have an online relationship with a woman living in [place]  Education:  [employer] Graduate taking business courses online  Educational Problems/Performance:  Religious Beliefs/Practices: Christian  History of Abuse: none Pensions consultant; Dispensing optician, [employer], Electrical engineer History:  Dispensing optician History: Was arrested at age 23 for possession of stolen property and spent 18 months in the county jail  Hobbies/Interests: Computers   Family History:   Family History  Problem Relation Age of Onset  . Depression Mother   . Diabetes Father   . Alcohol abuse Father     Mental Status Examination/Evaluation: Objective:  Appearance: Disheveled missing teeth, deviated right eye MalodorousDirty clothing   Eye Contact::  Good  Speech:  Clear  and Coherent  Volume:  Normal  Mood:  slightly blunted but generally good   Affect:  Blunt,  Thought Process: Fairly clear   Orientation:  Full (Time, Place, and Person)  Thought Content: Denies auditory or visual hallucinations or paranoia .  Suicidal Thoughts:  No  Homicidal Thoughts:  No  Judgement:  Impaired  Insight:  Lacking  Psychomotor Activity:  Normal  Akathisia:  No  Handed:  Right  AIMS (if indicated):    Assets:  Communication Skills Desire for Improvement    Laboratory/X-Ray Psychological Evaluation(s)   Basic metabolic panel, hemoglobin A1c and lipid panel      Assessment:  Axis I: Schizophrenia  AXIS I Schizophrenia  AXIS II Deferred  AXIS III Past Medical History:  Diagnosis Date  . Amblyopia of right eye   . Blindness of right eye   . COPD (chronic obstructive pulmonary disease) ([place])   . Hx of lead poisoning   . Panic attack   . Schizophrenia ([place])      AXIS IV other psychosocial or environmental problems  AXIS V 61-70 mild symptoms   Treatment Plan/Recommendations:  Plan of Care: Medication management   Laboratory:    Psychotherapy: He declined   Medications: he will continue Geodon 80 mg each bedtime for schizophrenia, and Cogentin 1 mg per day to prevent side effects from Geodon. He will Continue clonazepam 1 mg 3 times a day for anxiety and agitation.   Routine PRN Medications:  No  Consultations:   Safety Concerns:  He denies thoughts of wanting to harm self or others   Other:    will return in 3 months     Levonne Spiller, MD 8/10/20189:05 AM

## 2016-11-30 ENCOUNTER — Encounter (HOSPITAL_COMMUNITY): Payer: Self-pay | Admitting: Psychiatry

## 2016-11-30 ENCOUNTER — Ambulatory Visit (INDEPENDENT_AMBULATORY_CARE_PROVIDER_SITE_OTHER): Payer: Medicare Other | Admitting: Psychiatry

## 2016-11-30 VITALS — BP 144/73 | HR 88 | Ht 68.0 in | Wt 276.0 lb

## 2016-11-30 DIAGNOSIS — F200 Paranoid schizophrenia: Secondary | ICD-10-CM | POA: Diagnosis not present

## 2016-11-30 DIAGNOSIS — Z79899 Other long term (current) drug therapy: Secondary | ICD-10-CM

## 2016-11-30 MED ORDER — ZIPRASIDONE HCL 80 MG PO CAPS: 80.0000 mg | ORAL_CAPSULE | Freq: Every day | ORAL | 3 refills | Status: DC

## 2016-11-30 MED ORDER — BENZTROPINE MESYLATE 1 MG PO TABS: 1.0000 mg | ORAL_TABLET | Freq: Every day | ORAL | 3 refills | Status: DC

## 2016-11-30 MED ORDER — CLONAZEPAM 1 MG PO TABS: 1.0000 mg | ORAL_TABLET | Freq: Three times a day (TID) | ORAL | 2 refills | Status: DC
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
## 2017-02-26 ENCOUNTER — Telehealth (HOSPITAL_COMMUNITY): Payer: Self-pay

## 2017-02-26 NOTE — Telephone Encounter (Signed)
Mom will come with him to appt on 2/8

## 2017-02-26 NOTE — Telephone Encounter (Signed)
Patients mother called me, she is very concerned about patient. She states that he is spending all of his money on scams. She was very upset and crying. She does not believe that he is taking his medication. She would like a call back at [phone]

## 2017-03-01 ENCOUNTER — Encounter (HOSPITAL_COMMUNITY): Payer: Self-pay | Admitting: Psychiatry

## 2017-03-01 ENCOUNTER — Ambulatory Visit (INDEPENDENT_AMBULATORY_CARE_PROVIDER_SITE_OTHER): Payer: Medicare Other | Admitting: Psychiatry

## 2017-03-01 VITALS — BP 118/76 | HR 91 | Ht 68.0 in | Wt 257.0 lb

## 2017-03-01 DIAGNOSIS — M2550 Pain in unspecified joint: Secondary | ICD-10-CM | POA: Diagnosis not present

## 2017-03-01 DIAGNOSIS — Z87891 Personal history of nicotine dependence: Secondary | ICD-10-CM | POA: Diagnosis not present

## 2017-03-01 DIAGNOSIS — Z811 Family history of alcohol abuse and dependence: Secondary | ICD-10-CM

## 2017-03-01 DIAGNOSIS — M791 Myalgia, unspecified site: Secondary | ICD-10-CM

## 2017-03-01 DIAGNOSIS — Z736 Limitation of activities due to disability: Secondary | ICD-10-CM | POA: Diagnosis not present

## 2017-03-01 DIAGNOSIS — Z818 Family history of other mental and behavioral disorders: Secondary | ICD-10-CM | POA: Diagnosis not present

## 2017-03-01 DIAGNOSIS — F200 Paranoid schizophrenia: Secondary | ICD-10-CM

## 2017-03-01 MED ORDER — BENZTROPINE MESYLATE 1 MG PO TABS: 1.0000 mg | ORAL_TABLET | Freq: Every day | ORAL | 3 refills | Status: DC

## 2017-03-01 MED ORDER — ZIPRASIDONE HCL 40 MG PO CAPS: 40.0000 mg | ORAL_CAPSULE | Freq: Every day | ORAL | 2 refills | Status: DC

## 2017-03-01 MED ORDER — CLONAZEPAM 1 MG PO TABS: 1.0000 mg | ORAL_TABLET | Freq: Three times a day (TID) | ORAL | 2 refills | Status: DC

## 2017-03-01 MED ORDER — ZIPRASIDONE HCL 80 MG PO CAPS: 80.0000 mg | ORAL_CAPSULE | Freq: Every day | ORAL | 3 refills | Status: DC
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
## 2017-03-21 ENCOUNTER — Ambulatory Visit (HOSPITAL_COMMUNITY): Payer: Self-pay | Admitting: Licensed Clinical Social Worker

## 2017-05-29 ENCOUNTER — Ambulatory Visit (HOSPITAL_COMMUNITY): Payer: Self-pay | Admitting: Psychiatry

## 2017-06-03 ENCOUNTER — Ambulatory Visit (HOSPITAL_COMMUNITY): Payer: Self-pay | Admitting: Psychiatry

## 2017-06-07 ENCOUNTER — Ambulatory Visit (HOSPITAL_COMMUNITY): Payer: Self-pay | Admitting: Psychiatry

## 2017-06-24 ENCOUNTER — Ambulatory Visit (HOSPITAL_COMMUNITY): Payer: Medicare Other | Admitting: Psychiatry

## 2017-07-02 ENCOUNTER — Ambulatory Visit (INDEPENDENT_AMBULATORY_CARE_PROVIDER_SITE_OTHER): Payer: Medicare Other | Admitting: Psychiatry

## 2017-07-02 ENCOUNTER — Telehealth (HOSPITAL_COMMUNITY): Payer: Self-pay | Admitting: *Deleted

## 2017-07-02 ENCOUNTER — Encounter (HOSPITAL_COMMUNITY): Payer: Self-pay | Admitting: Psychiatry

## 2017-07-02 VITALS — BP 111/75 | HR 81 | Ht 68.0 in | Wt 275.0 lb

## 2017-07-02 DIAGNOSIS — Z87891 Personal history of nicotine dependence: Secondary | ICD-10-CM

## 2017-07-02 DIAGNOSIS — F419 Anxiety disorder, unspecified: Secondary | ICD-10-CM | POA: Diagnosis not present

## 2017-07-02 DIAGNOSIS — F200 Paranoid schizophrenia: Secondary | ICD-10-CM

## 2017-07-02 DIAGNOSIS — Z811 Family history of alcohol abuse and dependence: Secondary | ICD-10-CM

## 2017-07-02 DIAGNOSIS — Z91410 Personal history of adult physical and sexual abuse: Secondary | ICD-10-CM

## 2017-07-02 DIAGNOSIS — R454 Irritability and anger: Secondary | ICD-10-CM

## 2017-07-02 DIAGNOSIS — Z818 Family history of other mental and behavioral disorders: Secondary | ICD-10-CM | POA: Diagnosis not present

## 2017-07-02 DIAGNOSIS — Z638 Other specified problems related to primary support group: Secondary | ICD-10-CM | POA: Diagnosis not present

## 2017-07-02 DIAGNOSIS — H5461 Unqualified visual loss, right eye, normal vision left eye: Secondary | ICD-10-CM | POA: Diagnosis not present

## 2017-07-02 DIAGNOSIS — J449 Chronic obstructive pulmonary disease, unspecified: Secondary | ICD-10-CM | POA: Diagnosis not present

## 2017-07-02 DIAGNOSIS — F410 Panic disorder [episodic paroxysmal anxiety] without agoraphobia: Secondary | ICD-10-CM | POA: Diagnosis not present

## 2017-07-02 MED ORDER — ZIPRASIDONE HCL 80 MG PO CAPS: 80.0000 mg | ORAL_CAPSULE | Freq: Every day | ORAL | 3 refills | Status: DC

## 2017-07-02 MED ORDER — CLONAZEPAM 1 MG PO TABS: 1.0000 mg | ORAL_TABLET | Freq: Three times a day (TID) | ORAL | 2 refills | Status: DC

## 2017-07-02 MED ORDER — BENZTROPINE MESYLATE 1 MG PO TABS: 1.0000 mg | ORAL_TABLET | Freq: Every day | ORAL | 3 refills | Status: DC

## 2017-07-02 MED ORDER — ZIPRASIDONE HCL 40 MG PO CAPS: 40.0000 mg | ORAL_CAPSULE | Freq: Every day | ORAL | 2 refills | Status: DC
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
## 2017-07-02 NOTE — Telephone Encounter (Signed)
Spoke wit patient informed per Provider concerning the Geodon:Please call pt to make sure he gets it filled. thanks

## 2017-07-02 NOTE — Telephone Encounter (Signed)
Please call pt to make sure he gets it filled. thanks

## 2017-07-02 NOTE — Telephone Encounter (Signed)
Dr Harrington Challenger I called [facility] & spoke with Dorothea Ogle to check on the [place] script .  ziprasidone (GEODON) 80 MG capsule 90 capsule 3 07/02/2017    Sig - Route: Take 1 capsule (80 mg total) by mouth at bedtime. - Oral    Dorothea Ogle stated that the 80 mg is what he has on file & that the patent hasn't had if filled since March.

## 2017-07-16 ENCOUNTER — Encounter: Payer: Self-pay | Admitting: Gastroenterology

## 2017-10-02 ENCOUNTER — Ambulatory Visit (HOSPITAL_COMMUNITY): Payer: Self-pay | Admitting: Psychiatry

## 2017-10-02 DIAGNOSIS — Z1389 Encounter for screening for other disorder: Secondary | ICD-10-CM | POA: Diagnosis not present

## 2017-10-02 DIAGNOSIS — J449 Chronic obstructive pulmonary disease, unspecified: Secondary | ICD-10-CM | POA: Diagnosis not present

## 2017-10-02 DIAGNOSIS — Z0001 Encounter for general adult medical examination with abnormal findings: Secondary | ICD-10-CM | POA: Diagnosis not present

## 2017-10-02 DIAGNOSIS — Z0000 Encounter for general adult medical examination without abnormal findings: Secondary | ICD-10-CM | POA: Diagnosis not present

## 2017-10-02 DIAGNOSIS — R739 Hyperglycemia, unspecified: Secondary | ICD-10-CM | POA: Diagnosis not present

## 2017-10-15 ENCOUNTER — Encounter: Payer: Self-pay | Admitting: Gastroenterology

## 2017-10-24 ENCOUNTER — Ambulatory Visit (HOSPITAL_COMMUNITY): Payer: Self-pay | Admitting: Psychiatry

## 2017-11-25 ENCOUNTER — Ambulatory Visit (HOSPITAL_COMMUNITY): Payer: Medicare Other | Admitting: Psychiatry

## 2017-12-04 ENCOUNTER — Ambulatory Visit (HOSPITAL_COMMUNITY): Payer: Medicare Other | Admitting: Psychiatry

## 2017-12-26 ENCOUNTER — Telehealth: Payer: Self-pay | Admitting: Gastroenterology

## 2017-12-26 ENCOUNTER — Encounter: Payer: Self-pay | Admitting: Gastroenterology

## 2017-12-26 ENCOUNTER — Ambulatory Visit: Payer: Medicare Other | Admitting: Gastroenterology

## 2017-12-26 NOTE — Telephone Encounter (Signed)
REVIEWED-NO ADDITIONAL RECOMMENDATIONS. 

## 2017-12-26 NOTE — Telephone Encounter (Signed)
PATIENT WAS A NO SHOW AND LETTER SENT  °

## 2018-01-24 ENCOUNTER — Ambulatory Visit (HOSPITAL_COMMUNITY): Payer: Medicare Other | Admitting: Psychiatry

## 2018-02-12 ENCOUNTER — Ambulatory Visit (HOSPITAL_COMMUNITY): Payer: Medicare Other | Admitting: Psychiatry

## 2018-02-25 ENCOUNTER — Ambulatory Visit (HOSPITAL_COMMUNITY): Payer: Medicare HMO | Admitting: Psychiatry

## 2018-03-06 ENCOUNTER — Encounter (HOSPITAL_COMMUNITY): Payer: Self-pay | Admitting: Psychiatry

## 2018-03-06 ENCOUNTER — Ambulatory Visit (INDEPENDENT_AMBULATORY_CARE_PROVIDER_SITE_OTHER): Payer: Medicare Other | Admitting: Psychiatry

## 2018-03-06 VITALS — BP 135/77 | HR 92 | Ht 68.0 in | Wt 265.0 lb

## 2018-03-06 DIAGNOSIS — F200 Paranoid schizophrenia: Secondary | ICD-10-CM

## 2018-03-06 MED ORDER — BENZTROPINE MESYLATE 1 MG PO TABS: 1.0000 mg | ORAL_TABLET | Freq: Every day | ORAL | 3 refills | Status: DC

## 2018-03-06 MED ORDER — CLONAZEPAM 1 MG PO TABS: 1.0000 mg | ORAL_TABLET | Freq: Three times a day (TID) | ORAL | 2 refills | Status: DC

## 2018-03-06 MED ORDER — ZIPRASIDONE HCL 80 MG PO CAPS
80.0000 mg | ORAL_CAPSULE | Freq: Every day | ORAL | 3 refills | Status: DC
Start: 2018-03-06 — End: 2018-06-04
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
## 2018-03-31 ENCOUNTER — Ambulatory Visit: Payer: Medicare Other | Admitting: Nurse Practitioner

## 2018-03-31 ENCOUNTER — Encounter: Payer: Self-pay | Admitting: Gastroenterology

## 2018-03-31 ENCOUNTER — Telehealth: Payer: Self-pay | Admitting: Gastroenterology
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
## 2018-03-31 NOTE — Telephone Encounter (Signed)
PATIENT WAS A NO SHOW AND LETTER SENT  °

## 2018-05-18 IMAGING — DX DG CHEST 2V
2 series · 2 of 2 positions shown · non-contrast
Comparison: 12/18/2012 chest radiograph.

CLINICAL DATA: Simple chronic bronchitis.  COPD.

EXAM:
CHEST  2 VIEW

[chest pa]
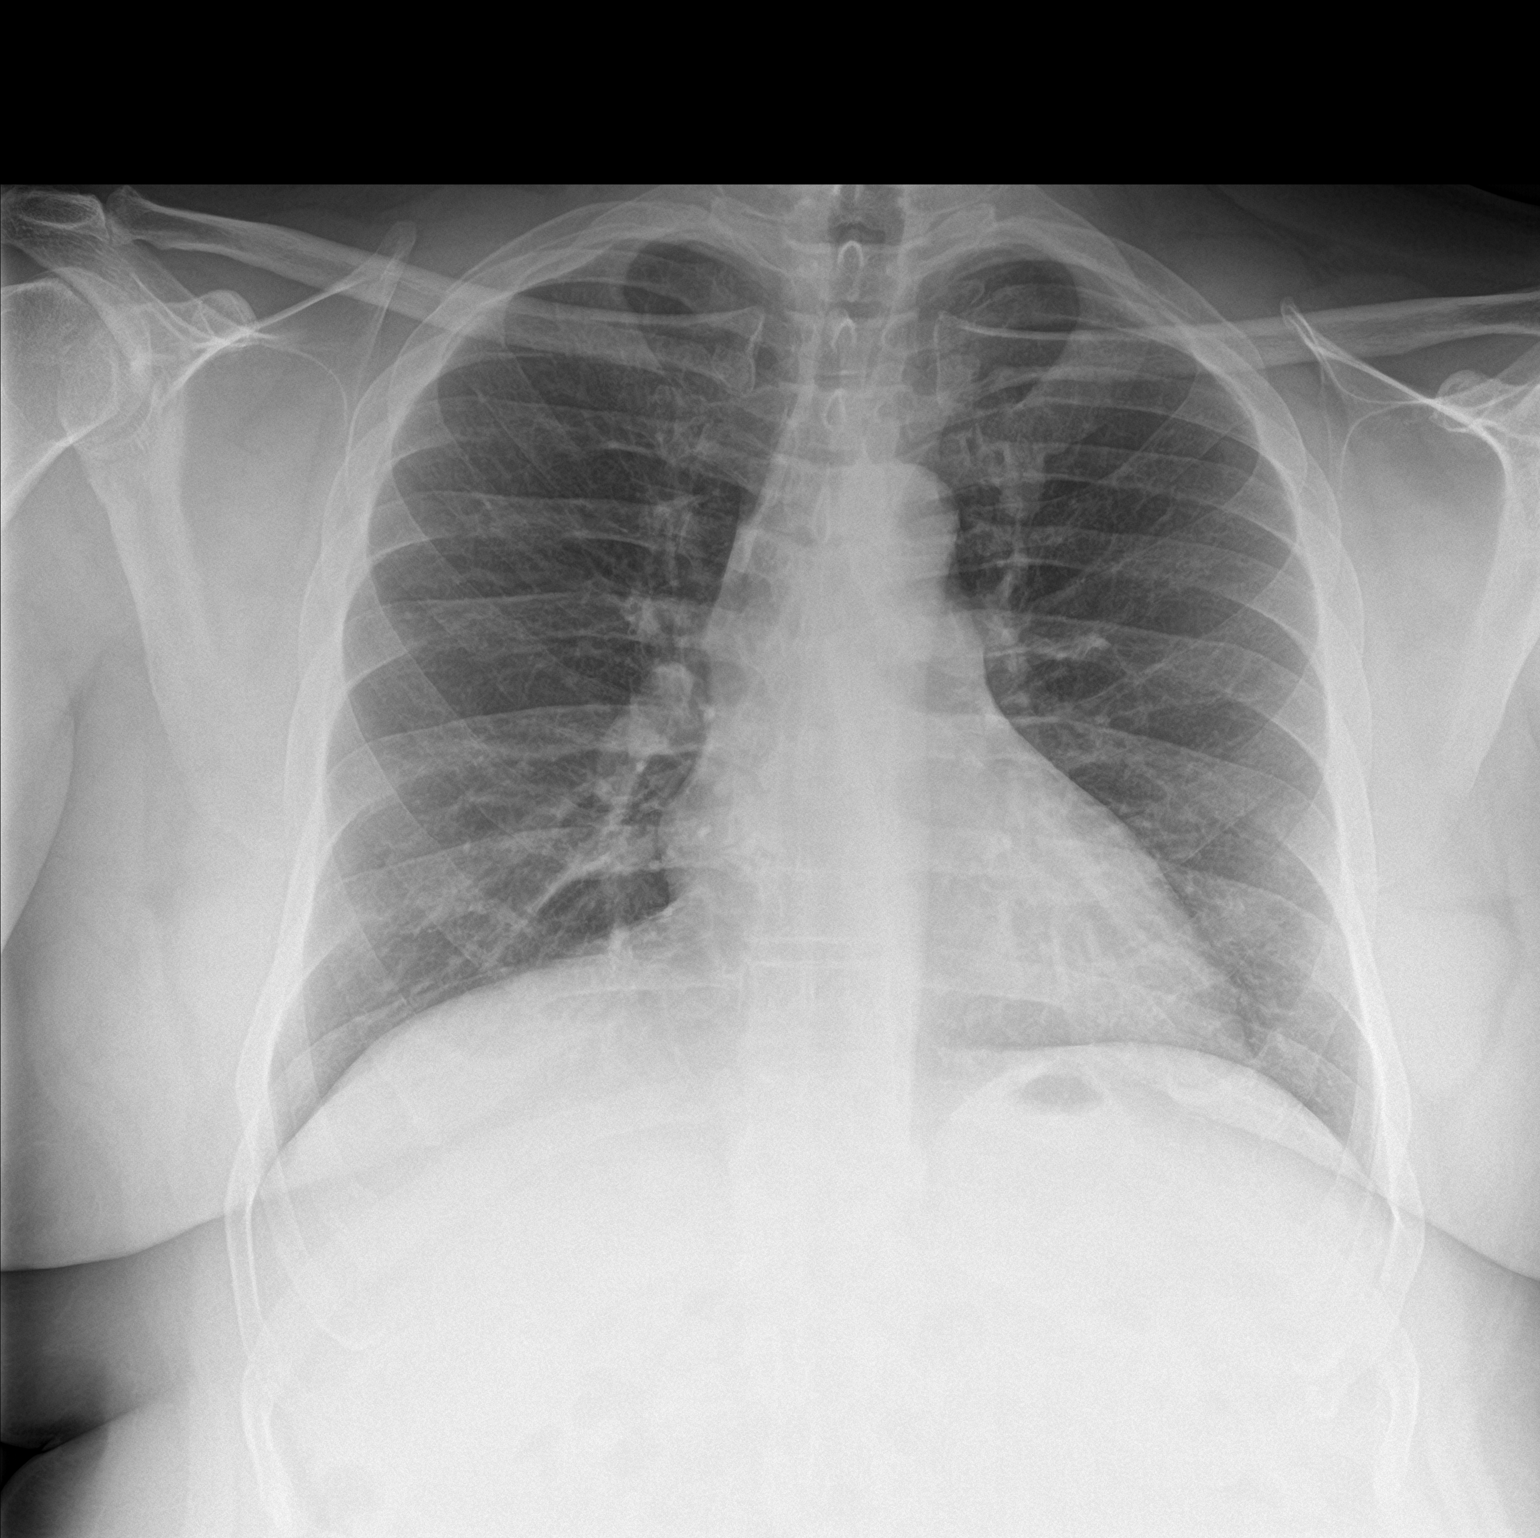

[chest lat]
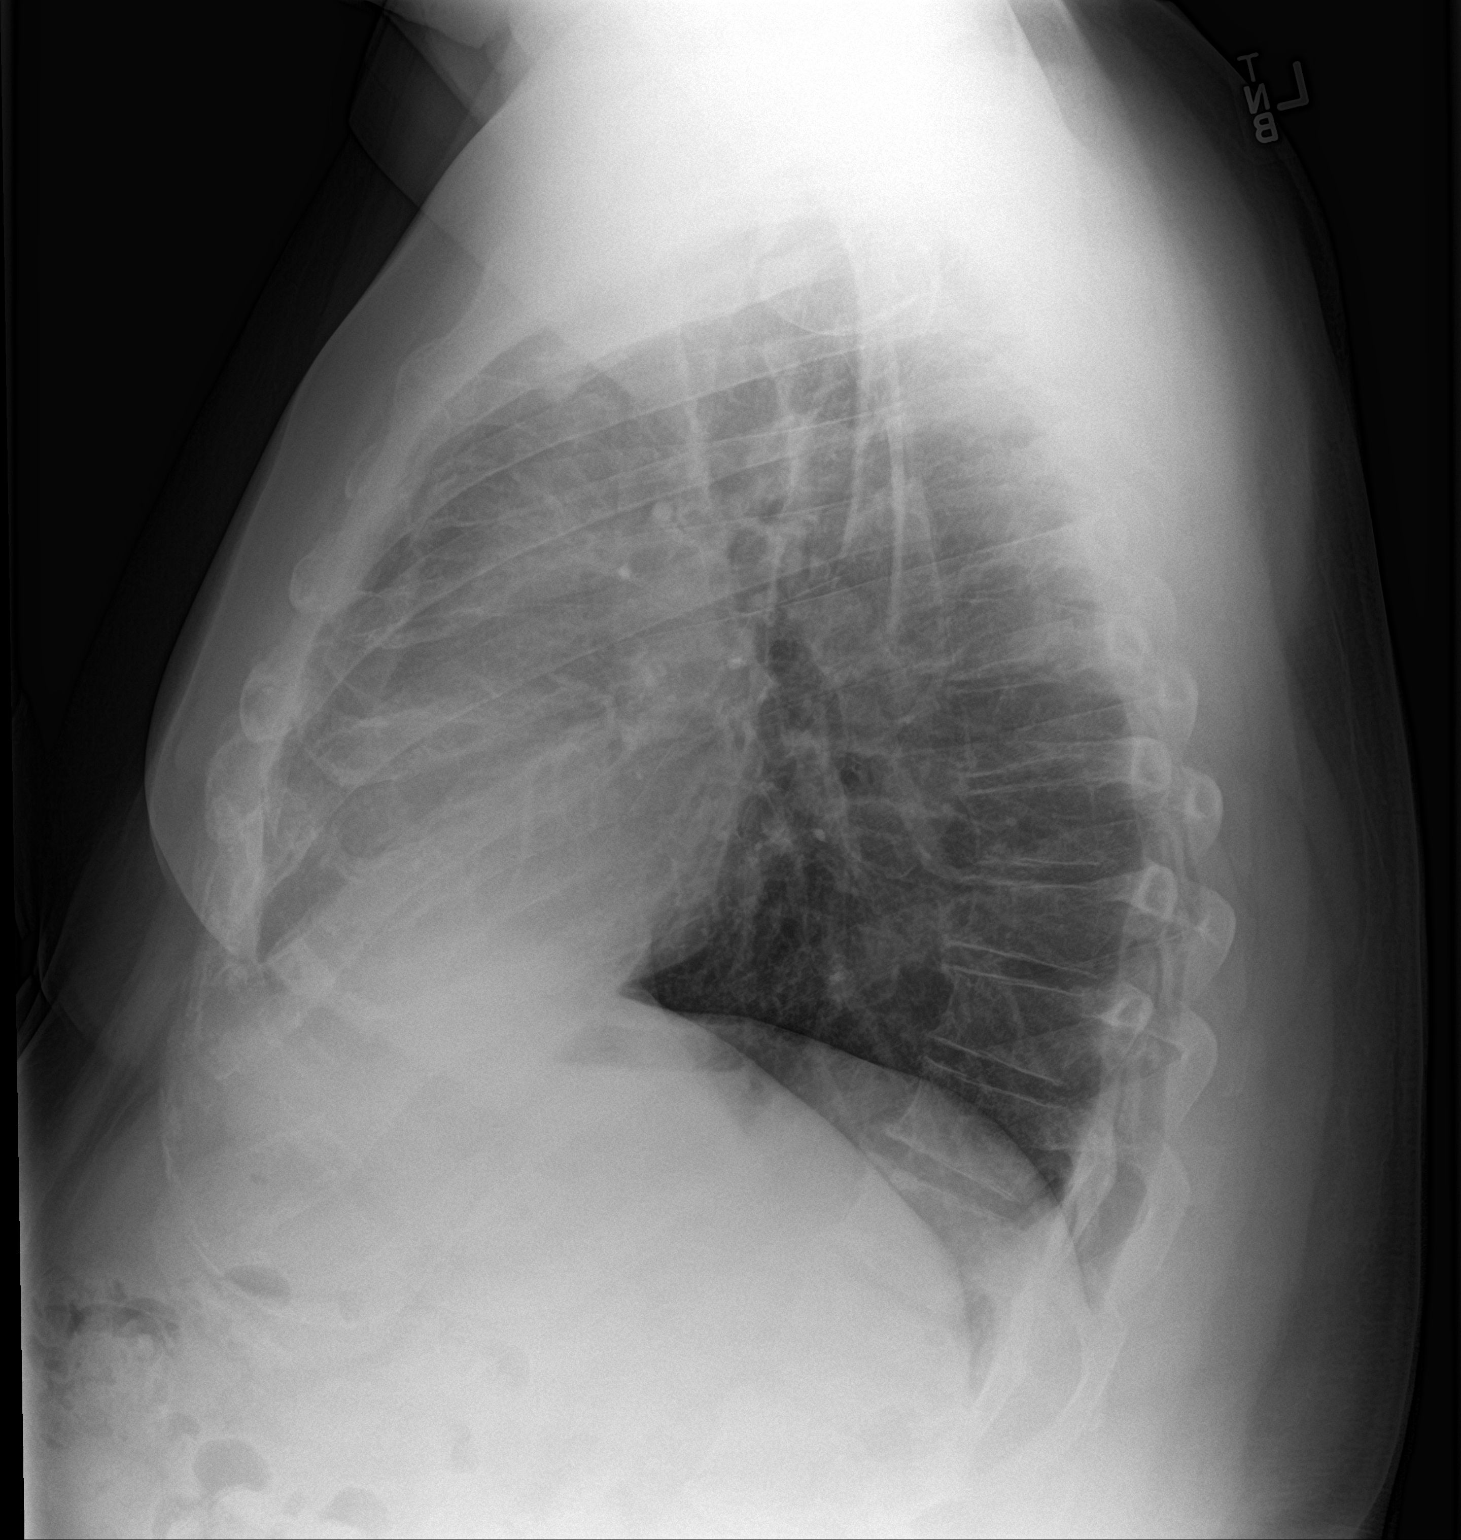

[2 of 2 positions shown; findings below may reference images not displayed]

FINDINGS: Stable cardiomediastinal silhouette with normal heart size. No
pneumothorax. No pleural effusion. Lungs appear clear, with no acute
consolidative airspace disease and no pulmonary edema.
IMPRESSION: No active cardiopulmonary disease.

## 2018-06-04 ENCOUNTER — Other Ambulatory Visit: Payer: Self-pay

## 2018-06-04 ENCOUNTER — Ambulatory Visit (INDEPENDENT_AMBULATORY_CARE_PROVIDER_SITE_OTHER): Payer: Medicare Other | Admitting: Psychiatry

## 2018-06-04 ENCOUNTER — Encounter (HOSPITAL_COMMUNITY): Payer: Self-pay | Admitting: Psychiatry

## 2018-06-04 DIAGNOSIS — F200 Paranoid schizophrenia: Secondary | ICD-10-CM

## 2018-06-04 MED ORDER — CLONAZEPAM 1 MG PO TABS: 1.0000 mg | ORAL_TABLET | Freq: Three times a day (TID) | ORAL | 3 refills | Status: DC

## 2018-06-04 MED ORDER — BENZTROPINE MESYLATE 1 MG PO TABS: 1.0000 mg | ORAL_TABLET | Freq: Every day | ORAL | 3 refills | Status: DC

## 2018-06-04 MED ORDER — ZIPRASIDONE HCL 80 MG PO CAPS: 80.0000 mg | ORAL_CAPSULE | Freq: Every day | ORAL | 3 refills | Status: DC
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
## 2018-10-06 ENCOUNTER — Ambulatory Visit (INDEPENDENT_AMBULATORY_CARE_PROVIDER_SITE_OTHER): Payer: Medicare Other | Admitting: Psychiatry

## 2018-10-06 ENCOUNTER — Encounter (HOSPITAL_COMMUNITY): Payer: Self-pay | Admitting: Psychiatry

## 2018-10-06 ENCOUNTER — Other Ambulatory Visit: Payer: Self-pay

## 2018-10-06 DIAGNOSIS — F200 Paranoid schizophrenia: Secondary | ICD-10-CM

## 2018-10-06 MED ORDER — ZIPRASIDONE HCL 80 MG PO CAPS: 80.0000 mg | ORAL_CAPSULE | Freq: Every day | ORAL | 3 refills | Status: DC

## 2018-10-06 MED ORDER — CLONAZEPAM 1 MG PO TABS: 1.0000 mg | ORAL_TABLET | Freq: Three times a day (TID) | ORAL | 3 refills | Status: DC

## 2018-10-06 MED ORDER — BENZTROPINE MESYLATE 1 MG PO TABS: 1.0000 mg | ORAL_TABLET | Freq: Every day | ORAL | 3 refills | Status: DC
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
## 2019-02-05 ENCOUNTER — Other Ambulatory Visit: Payer: Self-pay

## 2019-02-05 ENCOUNTER — Encounter (HOSPITAL_COMMUNITY): Payer: Self-pay | Admitting: Psychiatry

## 2019-02-05 ENCOUNTER — Ambulatory Visit (INDEPENDENT_AMBULATORY_CARE_PROVIDER_SITE_OTHER): Payer: Medicare Other | Admitting: Psychiatry

## 2019-02-05 DIAGNOSIS — F200 Paranoid schizophrenia: Secondary | ICD-10-CM

## 2019-02-05 MED ORDER — ZIPRASIDONE HCL 80 MG PO CAPS: 80.0000 mg | ORAL_CAPSULE | Freq: Every day | ORAL | 3 refills | Status: DC

## 2019-02-05 MED ORDER — BENZTROPINE MESYLATE 1 MG PO TABS: 1.0000 mg | ORAL_TABLET | Freq: Every day | ORAL | 3 refills | Status: DC

## 2019-02-05 MED ORDER — CLONAZEPAM 1 MG PO TABS: 1.0000 mg | ORAL_TABLET | Freq: Three times a day (TID) | ORAL | 3 refills | Status: DC
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
## 2019-06-10 ENCOUNTER — Telehealth (INDEPENDENT_AMBULATORY_CARE_PROVIDER_SITE_OTHER): Payer: Medicare Other | Admitting: Psychiatry

## 2019-06-10 ENCOUNTER — Encounter (HOSPITAL_COMMUNITY): Payer: Self-pay | Admitting: Psychiatry

## 2019-06-10 ENCOUNTER — Other Ambulatory Visit: Payer: Self-pay

## 2019-06-10 DIAGNOSIS — F200 Paranoid schizophrenia: Secondary | ICD-10-CM | POA: Diagnosis not present

## 2019-06-10 MED ORDER — ZIPRASIDONE HCL 80 MG PO CAPS: 80.0000 mg | ORAL_CAPSULE | Freq: Every day | ORAL | 3 refills | Status: DC

## 2019-06-10 MED ORDER — BENZTROPINE MESYLATE 1 MG PO TABS: 1.0000 mg | ORAL_TABLET | Freq: Every day | ORAL | 3 refills | Status: DC

## 2019-06-10 MED ORDER — CLONAZEPAM 1 MG PO TABS: 1.0000 mg | ORAL_TABLET | Freq: Three times a day (TID) | ORAL | 3 refills | Status: DC
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
## 2019-09-15 ENCOUNTER — Other Ambulatory Visit: Payer: Self-pay

## 2019-09-15 ENCOUNTER — Telehealth (INDEPENDENT_AMBULATORY_CARE_PROVIDER_SITE_OTHER): Payer: Medicare Other | Admitting: Psychiatry

## 2019-09-15 ENCOUNTER — Encounter (HOSPITAL_COMMUNITY): Payer: Self-pay | Admitting: Psychiatry

## 2019-09-15 DIAGNOSIS — F200 Paranoid schizophrenia: Secondary | ICD-10-CM

## 2019-09-15 MED ORDER — CLONAZEPAM 1 MG PO TABS: 1.0000 mg | ORAL_TABLET | Freq: Three times a day (TID) | ORAL | 3 refills | Status: AC

## 2019-09-15 MED ORDER — ZIPRASIDONE HCL 80 MG PO CAPS: 80.0000 mg | ORAL_CAPSULE | Freq: Every day | ORAL | 3 refills | Status: DC

## 2019-09-15 MED ORDER — BENZTROPINE MESYLATE 1 MG PO TABS: 1.0000 mg | ORAL_TABLET | Freq: Every day | ORAL | 3 refills | Status: DC
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
## 2019-10-12 ENCOUNTER — Telehealth (HOSPITAL_COMMUNITY): Payer: Medicare Other | Admitting: Psychiatry

## 2019-11-09 ENCOUNTER — Telehealth (HOSPITAL_COMMUNITY): Payer: Self-pay | Admitting: *Deleted

## 2019-11-09 NOTE — Telephone Encounter (Signed)
Patient mother called stating she is patient have been arguing since her appt and the police came out and she wants provider to do IBC papers. Staff informed patient mother that provider may not be able to do those papers and instructed her where to get those papers from and file it with the court and patient stated she wants provider to do them.

## 2019-11-17 ENCOUNTER — Encounter (HOSPITAL_COMMUNITY): Payer: Self-pay | Admitting: Emergency Medicine

## 2019-11-17 ENCOUNTER — Emergency Department (HOSPITAL_COMMUNITY)
Admission: EM | Admit: 2019-11-17 | Discharge: 2019-11-19 | Disposition: A | Discharge status: Home / Self Care | Payer: Medicare Other | Attending: Emergency Medicine | Admitting: Emergency Medicine

## 2019-11-17 ENCOUNTER — Other Ambulatory Visit: Payer: Self-pay

## 2019-11-17 DIAGNOSIS — F203 Undifferentiated schizophrenia: Secondary | ICD-10-CM | POA: Diagnosis not present

## 2019-11-17 DIAGNOSIS — R259 Unspecified abnormal involuntary movements: Secondary | ICD-10-CM | POA: Diagnosis present

## 2019-11-17 DIAGNOSIS — Z87891 Personal history of nicotine dependence: Secondary | ICD-10-CM | POA: Diagnosis not present

## 2019-11-17 DIAGNOSIS — Z79899 Other long term (current) drug therapy: Secondary | ICD-10-CM | POA: Diagnosis not present

## 2019-11-17 DIAGNOSIS — J449 Chronic obstructive pulmonary disease, unspecified: Secondary | ICD-10-CM | POA: Insufficient documentation

## 2019-11-17 DIAGNOSIS — Z20822 Contact with and (suspected) exposure to covid-19: Secondary | ICD-10-CM | POA: Diagnosis not present

## 2019-11-17 MED ORDER — CLONAZEPAM 0.5 MG PO TABS
1.0000 mg | ORAL_TABLET | Freq: Three times a day (TID) | ORAL | Status: DC
  Administered 2019-11-18 (×4): 1 mg via ORAL
  Filled 2019-11-17 (×4): qty 2

## 2019-11-17 MED ORDER — BENZTROPINE MESYLATE 1 MG PO TABS
1.0000 mg | ORAL_TABLET | Freq: Every day | ORAL | Status: DC
  Administered 2019-11-18 (×2): 1 mg via ORAL
  Filled 2019-11-17 (×2): qty 1

## 2019-11-17 MED ORDER — ALUM & MAG HYDROXIDE-SIMETH 200-200-20 MG/5ML PO SUSP: 30.0000 mL | Freq: Four times a day (QID) | ORAL | Status: DC | PRN

## 2019-11-17 MED ORDER — ACETAMINOPHEN 325 MG PO TABS: 650.0000 mg | ORAL_TABLET | ORAL | Status: DC | PRN

## 2019-11-17 MED ORDER — ONDANSETRON HCL 4 MG PO TABS: 4.0000 mg | ORAL_TABLET | Freq: Three times a day (TID) | ORAL | Status: DC | PRN

## 2019-11-17 MED ORDER — MOMETASONE FURO-FORMOTEROL FUM 200-5 MCG/ACT IN AERO
2.0000 | INHALATION_SPRAY | Freq: Two times a day (BID) | RESPIRATORY_TRACT | Status: DC
  Filled 2019-11-17 (×2): qty 8.8

## 2019-11-17 MED ORDER — ZIPRASIDONE HCL 20 MG PO CAPS
80.0000 mg | ORAL_CAPSULE | Freq: Every day | ORAL | Status: DC
  Administered 2019-11-18 (×2): 80 mg via ORAL
  Filled 2019-11-17 (×2): qty 4

## 2019-11-17 NOTE — ED Triage Notes (Signed)
Pt arrives in police custody. Mother IVCd pt bc she felt he was going to harm himself or someone else. Pt has diagnosis of schizophrenia. Pt states his mother does not respect his ideas and fights with him about the law. 

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
## 2019-11-17 NOTE — ED Notes (Signed)
Pt changed into appropriate hospital attire, clothing & wallet put in pt belonging bag & placed in labeled locker, pt wanded by security. Resting calmly in room at this time, sitter present.

## 2019-11-18 DIAGNOSIS — F203 Undifferentiated schizophrenia: Secondary | ICD-10-CM | POA: Diagnosis not present

## 2019-11-18 LAB — CBC WITH DIFFERENTIAL/PLATELET
Abs Immature Granulocytes: 0.02 10*3/uL (ref 0.00–0.07)
Basophils Absolute: 0 10*3/uL (ref 0.0–0.1)
Basophils Relative: 1 %
Eosinophils Absolute: 0.1 10*3/uL (ref 0.0–0.5)
Eosinophils Relative: 2 %
HCT: 49.7 % (ref 39.0–52.0)
Hemoglobin: 15.7 g/dL (ref 13.0–17.0)
Immature Granulocytes: 0 %
Lymphocytes Relative: 35 %
Lymphs Abs: 2.3 10*3/uL (ref 0.7–4.0)
MCH: 26.6 pg (ref 26.0–34.0)
MCHC: 31.6 g/dL (ref 30.0–36.0)
MCV: 84.2 fL (ref 80.0–100.0)
Monocytes Absolute: 0.4 10*3/uL (ref 0.1–1.0)
Monocytes Relative: 6 %
Neutro Abs: 3.8 10*3/uL (ref 1.7–7.7)
Neutrophils Relative %: 56 %
Platelets: 170 10*3/uL (ref 150–400)
RBC: 5.9 MIL/uL — ABNORMAL HIGH (ref 4.22–5.81)
RDW: 17.2 % — ABNORMAL HIGH (ref 11.5–15.5)
WBC: 6.7 10*3/uL (ref 4.0–10.5)
nRBC: 0 % (ref 0.0–0.2)

## 2019-11-18 LAB — COMPREHENSIVE METABOLIC PANEL
ALT: 45 U/L — ABNORMAL HIGH (ref 0–44)
AST: 36 U/L (ref 15–41)
Albumin: 3.9 g/dL (ref 3.5–5.0)
Alkaline Phosphatase: 102 U/L (ref 38–126)
Anion gap: 10 (ref 5–15)
BUN: 15 mg/dL (ref 8–23)
CO2: 24 mmol/L (ref 22–32)
Calcium: 9.4 mg/dL (ref 8.9–10.3)
Chloride: 101 mmol/L (ref 98–111)
Creatinine, Ser: 1.13 mg/dL (ref 0.61–1.24)
GFR, Estimated: >60 mL/min (ref >60)
Glucose, Bld: 108 mg/dL — ABNORMAL HIGH (ref 70–99)
Potassium: 4.1 mmol/L (ref 3.5–5.1)
Sodium: 135 mmol/L (ref 135–145)
Total Bilirubin: 0.8 mg/dL (ref 0.3–1.2)
Total Protein: 8.4 g/dL — ABNORMAL HIGH (ref 6.5–8.1)

## 2019-11-18 LAB — RAPID URINE DRUG SCREEN, HOSP PERFORMED
Amphetamines: NOT DETECTED
Barbiturates: NOT DETECTED
Benzodiazepines: NOT DETECTED
Cocaine: NOT DETECTED
Opiates: NOT DETECTED
Tetrahydrocannabinol: NOT DETECTED

## 2019-11-18 LAB — RESPIRATORY PANEL BY RT PCR (FLU A&B, COVID)
Influenza A by PCR: NEGATIVE
Influenza B by PCR: NEGATIVE
SARS Coronavirus 2 by RT PCR: NEGATIVE

## 2019-11-18 LAB — ETHANOL: Alcohol, Ethyl (B): <10 mg/dL (ref <10)

## 2019-11-18 NOTE — ED Notes (Signed)
Pt currently getting TTS done
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
## 2019-11-18 NOTE — ED Notes (Signed)
Pt resting in bed and appears to be in NAD. Will continue to monitor.

## 2019-11-18 NOTE — Progress Notes (Signed)
CSW spoke to Coleman with the [facility]. She stated that they have no records of this patient in their system, however reported she would search the national database in attempt to locate this patient in their system.   Per Claiborne Billings, the [facility] is currently on diversion, but stated if a bed was to open up she would reach out to let CSW know.    Darletta Moll MSW, LCSW Clincal Social Worker  [facility]

## 2019-11-18 NOTE — ED Notes (Signed)
PT ambulated to bathroom. Gate steady
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
## 2019-11-18 NOTE — ED Notes (Signed)
Pt sitting in bed and appears to be in NAD at this time. Pt refusing blanket and denied any other needs. Will continue to monitor.

## 2019-11-18 NOTE — ED Notes (Signed)
Pt on video chat with Tele Psych

## 2019-11-19 ENCOUNTER — Telehealth (HOSPITAL_COMMUNITY): Payer: Self-pay | Admitting: *Deleted

## 2019-11-19 NOTE — ED Notes (Signed)
IVC papers faxed to [facility] as requested. [phone] fax

## 2019-11-19 NOTE — ED Notes (Addendum)
This nurse has called pts. Mom three times to update mom. Moms phone does not have a voicemail.

## 2019-11-19 NOTE — ED Notes (Signed)
Patient wanting all lights in room off.

## 2019-11-19 NOTE — ED Notes (Signed)
Pharmacy aware of needing Klonopin .

## 2019-11-19 NOTE — ED Notes (Addendum)
Mother requesting nurse to come into waiting area to discuss care of pt. Mother is upset and says she does not like Engineer, technical sales. Mother states " I have been here before, Forestine Na will put you in the hallway and no one will check on you. You are just left. Being in the hallway is degrading and my son said he has been in the hallway 3 days and I demand to go see if he is in the hallway." Nurse explained pt is in a room and is unaware if he at first was in the hallway waiting for a room to become open In the ER. Nurse explained people who are IVC have certain rules to follow and visitation is one of those things. Nurse explained visitations are 8 am , 12 pm and 5 pm for 30 minutes. Nurse also explained if pt's are having behavior issues family may not get to visit due to safety issues . Nurse explained pt had no outbursts or behavior issues this morning until mom talked to him on the phone. Mom aggressively stated "No, I did not set him off, being in the hallway for 3 days is what made him upset. I will be here at 5pm and I will get to see my son with my own eyes regardless if he has had behaviors or not." Nurse made mom aware that pt had a issue to where staff had to call security for safety of staff. Mom says she knows he could hurt someone that's why she sent him here. But mom also said " I refuse, to be told I can not see him at 5pm ." Nurse explain rules once again and also, let her know the social worker would be calling her to address some of her issues with care.

## 2019-11-19 NOTE — Progress Notes (Signed)
Pt accepted to [facility], bed 157-1   Dr. Jannette Spanner is the accepting/attending provider.    Call report to [phone]  [facility] ED notified.     Pt is involuntary and will be transported by law enforcement  IVC paperwork should be faxed to [facility] at [phone] prior to transport  Pt may arrive at [facility] anytime today before 11pm.    Audree Camel, MSW, LCSW, [place] Worker II Disposition CSW [phone]

## 2019-11-19 NOTE — ED Notes (Signed)
Patient resting with eyes closed and snoring at this time. Patient has equal respirations , rise and fall of chest.

## 2019-11-19 NOTE — ED Notes (Signed)
Patient awake at this time. Asked patient if he needed a blanket, patient states that he is fine.

## 2019-11-19 NOTE — Progress Notes (Signed)
Pt meets inpatient criteria per Mordecai Maes, NP. Referral information has been sent to the following hospitals for review:  [facility]  [facility]     Disposition will continue to assist with inpatient placement needs.   Audree Camel, MSW, LCSW, [place] Clinical Social Worker II Disposition CSW [phone]

## 2019-11-19 NOTE — BHH Counselor (Signed)
Lyn Henri, RN, notified through secure chat that per Mordecai Maes, NP, patient continues to meet criteria for inpatient treatment.

## 2019-11-19 NOTE — ED Provider Notes (Signed)
Patient's been accepted by [facility] medical Dr. Georgia Dom patient's transfer paperwork completed.   Fredia Sorrow, MD 11/19/19 [phone]

## 2019-11-19 NOTE — Telephone Encounter (Signed)
Please confirm to discharge patient.
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
## 2019-11-19 NOTE — ED Notes (Signed)
Attempted report x1. 

## 2019-11-19 NOTE — ED Notes (Addendum)
Mother called and asked to speak to pt. Mother spoke with son for around a minute and pt. Became very agitated. Pt. Was calm this morning watching TV not agitated until they spoke with mom. Pt. Handed phone to sitter Hassan Rowan and started yelling " you have shamed me in front of the whole world." Pt. Also stated that " he has never hurt a women and had helped thousands of women". Pt. Continued to yell in the hallway. Notified security. Nurse Vaughan Basta and I got pt. To return back to room where pt. Continued to yell. Pt. Was asked to bring their voice down. Pt. Kept repeating that they have been shamed and that no vet should be treated this way. Nurse Vaughan Basta spoke with pt. And pt. Began to settle down. Currently pt. Is calm and in their room.

## 2019-11-19 NOTE — ED Notes (Signed)
Pt refused lunch x1. Nurse went in a few minutes later and pt agreed to eat. Pt stated " I am hungry I just refused earlier because I was mad". Food tray given to pt, while [employer] security in the room talking to pt.

## 2019-11-19 NOTE — Telephone Encounter (Signed)
Yes, his problems are beyond the scope of this office, needs ACCT team

## 2019-12-16 ENCOUNTER — Telehealth (HOSPITAL_COMMUNITY): Payer: Medicare Other | Admitting: Psychiatry

## 2020-01-13 ENCOUNTER — Other Ambulatory Visit: Payer: Self-pay

## 2020-01-13 ENCOUNTER — Emergency Department (HOSPITAL_COMMUNITY)
Admission: EM | Admit: 2020-01-13 | Discharge: 2020-01-14 | Disposition: A | Discharge status: Other Acute Inpatient Hospital | Payer: Medicare Other | Attending: Emergency Medicine | Admitting: Emergency Medicine

## 2020-01-13 ENCOUNTER — Encounter (HOSPITAL_COMMUNITY): Payer: Self-pay

## 2020-01-13 DIAGNOSIS — Z87891 Personal history of nicotine dependence: Secondary | ICD-10-CM | POA: Insufficient documentation

## 2020-01-13 DIAGNOSIS — F209 Schizophrenia, unspecified: Secondary | ICD-10-CM | POA: Diagnosis not present

## 2020-01-13 DIAGNOSIS — Z046 Encounter for general psychiatric examination, requested by authority: Secondary | ICD-10-CM | POA: Diagnosis not present

## 2020-01-13 DIAGNOSIS — F203 Undifferentiated schizophrenia: Secondary | ICD-10-CM

## 2020-01-13 DIAGNOSIS — F200 Paranoid schizophrenia: Secondary | ICD-10-CM | POA: Insufficient documentation

## 2020-01-13 DIAGNOSIS — J441 Chronic obstructive pulmonary disease with (acute) exacerbation: Secondary | ICD-10-CM | POA: Diagnosis not present

## 2020-01-13 DIAGNOSIS — Z79899 Other long term (current) drug therapy: Secondary | ICD-10-CM | POA: Insufficient documentation

## 2020-01-13 DIAGNOSIS — Z20822 Contact with and (suspected) exposure to covid-19: Secondary | ICD-10-CM | POA: Insufficient documentation

## 2020-01-13 LAB — CBC WITH DIFFERENTIAL/PLATELET
Abs Immature Granulocytes: 0.02 10*3/uL (ref 0.00–0.07)
Basophils Absolute: 0 10*3/uL (ref 0.0–0.1)
Basophils Relative: 1 %
Eosinophils Absolute: 0.1 10*3/uL (ref 0.0–0.5)
Eosinophils Relative: 1 %
HCT: 48.5 % (ref 39.0–52.0)
Hemoglobin: 15.4 g/dL (ref 13.0–17.0)
Immature Granulocytes: 0 %
Lymphocytes Relative: 29 %
Lymphs Abs: 2.4 10*3/uL (ref 0.7–4.0)
MCH: 26.5 pg (ref 26.0–34.0)
MCHC: 31.8 g/dL (ref 30.0–36.0)
MCV: 83.5 fL (ref 80.0–100.0)
Monocytes Absolute: 0.6 10*3/uL (ref 0.1–1.0)
Monocytes Relative: 7 %
Neutro Abs: 5.4 10*3/uL (ref 1.7–7.7)
Neutrophils Relative %: 62 %
Platelets: 220 10*3/uL (ref 150–400)
RBC: 5.81 MIL/uL (ref 4.22–5.81)
RDW: 16.6 % — ABNORMAL HIGH (ref 11.5–15.5)
WBC: 8.5 10*3/uL (ref 4.0–10.5)
nRBC: 0 % (ref 0.0–0.2)

## 2020-01-13 LAB — COMPREHENSIVE METABOLIC PANEL
ALT: 28 U/L (ref 0–44)
AST: 21 U/L (ref 15–41)
Albumin: 4.2 g/dL (ref 3.5–5.0)
Alkaline Phosphatase: 113 U/L (ref 38–126)
Anion gap: 12 (ref 5–15)
BUN: 14 mg/dL (ref 8–23)
CO2: 20 mmol/L — ABNORMAL LOW (ref 22–32)
Calcium: 9.5 mg/dL (ref 8.9–10.3)
Chloride: 104 mmol/L (ref 98–111)
Creatinine, Ser: 1.41 mg/dL — ABNORMAL HIGH (ref 0.61–1.24)
GFR, Estimated: 56 mL/min — ABNORMAL LOW (ref >60)
Glucose, Bld: 118 mg/dL — ABNORMAL HIGH (ref 70–99)
Potassium: 3.9 mmol/L (ref 3.5–5.1)
Sodium: 136 mmol/L (ref 135–145)
Total Bilirubin: 1.3 mg/dL — ABNORMAL HIGH (ref 0.3–1.2)
Total Protein: 8.4 g/dL — ABNORMAL HIGH (ref 6.5–8.1)

## 2020-01-13 LAB — RAPID URINE DRUG SCREEN, HOSP PERFORMED
Amphetamines: NOT DETECTED
Barbiturates: NOT DETECTED
Benzodiazepines: NOT DETECTED
Cocaine: NOT DETECTED
Opiates: NOT DETECTED
Tetrahydrocannabinol: NOT DETECTED

## 2020-01-13 LAB — ETHANOL: Alcohol, Ethyl (B): <10 mg/dL (ref <10)

## 2020-01-13 MED ORDER — ZIPRASIDONE HCL 20 MG PO CAPS
40.0000 mg | ORAL_CAPSULE | Freq: Once | ORAL | Status: AC
  Administered 2020-01-13: 21:00:00 40 mg via ORAL
  Filled 2020-01-13: qty 2

## 2020-01-13 MED ORDER — BENZTROPINE MESYLATE 1 MG PO TABS
1.0000 mg | ORAL_TABLET | Freq: Once | ORAL | Status: AC
  Administered 2020-01-13: 21:00:00 1 mg via ORAL
  Filled 2020-01-13: qty 1

## 2020-01-13 MED ORDER — CLONAZEPAM 0.5 MG PO TABS
0.5000 mg | ORAL_TABLET | Freq: Three times a day (TID) | ORAL | Status: DC
  Administered 2020-01-14: 10:00:00 0.5 mg via ORAL
  Filled 2020-01-13: qty 1

## 2020-01-13 MED ORDER — BENZTROPINE MESYLATE 1 MG PO TABS: 1.0000 mg | ORAL_TABLET | Freq: Every day | ORAL | Status: DC

## 2020-01-13 MED ORDER — CLONAZEPAM 0.5 MG PO TABS
1.0000 mg | ORAL_TABLET | Freq: Once | ORAL | Status: AC
  Administered 2020-01-13: 21:00:00 1 mg via ORAL
  Filled 2020-01-13: qty 2

## 2020-01-13 MED ORDER — ZIPRASIDONE HCL 20 MG PO CAPS
40.0000 mg | ORAL_CAPSULE | Freq: Two times a day (BID) | ORAL | Status: DC
  Administered 2020-01-13 – 2020-01-14 (×2): 40 mg via ORAL
  Filled 2020-01-13 (×2): qty 2

## 2020-01-13 NOTE — ED Triage Notes (Signed)
Pt to er, pt states that he is here because he got tired of racism.  Pt presents to the er in handcuffs with [place] [employer], pt states that he got angry and he pulled his sword, pt states that they were called for a well fare check and before they could get to the door he came running out with a three foot sword and another weapon.

## 2020-01-13 NOTE — ED Notes (Signed)
IVC papers faxed to Magistrate and they were called to confirm.

## 2020-01-13 NOTE — BH Assessment (Signed)
Harriett Sine, NP recommends inpt psychiatric tx

## 2020-01-13 NOTE — ED Notes (Signed)
Patient states that he has been here 3 hours and he is ready to go. Patient states that tel pysch cleared him to go home. LEt patient know that I would make his nurse aware and for her to come speak with him.

## 2020-01-13 NOTE — ED Provider Notes (Signed)
Terry [name] is a 64 y.o. male, presenting to the ED with paranoia and agitation.  HPI from Margarita Mail, PA-C: "Terry [name] is a 64 y.o. male with a past medical history of paranoid schizophrenia, COPD who was brought in by [employer] after his sister initiated a welfare check.  Apparently the patient has not been taking his psychiatric medications and she was worried because of his recent behavior.  Upon arrival to the patient's house the patient ran out with a sword and tried attacking the police.  He was brought in in handcuffs.  He states to me that he has history of high-level government clearance.  That he is angry because he hears that "[place] is racist and I never saw that growing up there."  He states that he wants to go there and "walk the streets for myself and see this."  He states that someone has a race that is identity and he needs to go to [place] to check it.  Has no other complaints.  He denies auditory or visual hallucinations."  Past Medical History:  Diagnosis Date  . Amblyopia of right eye   . Blindness of right eye   . COPD (chronic obstructive pulmonary disease) ([place])   . Hx of lead poisoning   . Panic attack   . Schizophrenia ([place])     Physical Exam  BP (!) 158/96 (BP Location: Right Arm)   Pulse (!) 133   Temp (!) 97.5 F (36.4 C) (Oral)   Resp 20   Ht 5\' 8"  (1.727 m)   Wt 101.2 kg   SpO2 100%   BMI 33.91 kg/m   Physical Exam Vitals and nursing note reviewed.  Constitutional:      General: He is not in acute distress.    Appearance: He is well-developed and well-nourished. He is not diaphoretic.  HENT:     [name]: Normocephalic and atraumatic.  Eyes:     Conjunctiva/sclera: Conjunctivae normal.  Cardiovascular:     Rate and Rhythm: Normal rate and regular rhythm.  Pulmonary:     Effort: Pulmonary effort is normal.  Musculoskeletal:     Cervical back: Neck supple.  Skin:    General: Skin is warm and dry.      Coloration: Skin is not pale.  Neurological:     Mental Status: He is alert.  Psychiatric:        Mood and Affect: Affect is labile.        Speech: Speech is rapid and pressured and tangential.        Behavior: Behavior is agitated and hyperactive. Behavior is not aggressive.     ED Course/Procedures     Procedures   Abnormal Labs Reviewed  COMPREHENSIVE METABOLIC PANEL - Abnormal; Notable for the following components:      Result Value   CO2 20 (*)    Glucose, Bld 118 (*)    Creatinine, Ser 1.41 (*)    Total Protein 8.4 (*)    Total Bilirubin 1.3 (*)    GFR, Estimated 56 (*)    All other components within normal limits  CBC WITH DIFFERENTIAL/PLATELET - Abnormal; Notable for the following components:   RDW 16.6 (*)    All other components within normal limits       MDM   Clinical Course as of 01/14/20 0004  Wed Jan 13, 2020  [place] medication list reviewed.  Medication orders here in the ED updated to more closely reflect  his prescribed home medications.  I also think additional medication is prudent due to his current presentation of agitation. [SJ]    Clinical Course User Index [SJ] Lorayne Bender, PA-C   Patient care handoff report received from Margarita Mail, PA-C. Plan: Patient under IVC.  Will need to be medically cleared.  Labs pending.   Patient presents under IVC.  He is agitated and requires persistent verbal redirection here in the ED. Evaluated by TTS and recommended for inpatient psychiatric treatment. Suspect slight increase in creatinine and small decrease in CO2 are due to poor oral intake.  Patient eating and taking in fluids here in the ED.  Patient medically cleared.  Home medications ordered. Patient currently awaiting placement.     Vitals:   01/13/20 1524 01/13/20 1526 01/13/20 2020 01/13/20 2328  BP: (!) 158/96  106/84   Pulse: (!) 133  (!) 123 76  Resp: 20  20   Temp: (!) 97.5 F (36.4 C)  99.5 F (37.5 C)   TempSrc: Oral   Oral   SpO2: 100%  100% 96%  Weight:  101.2 kg    Height:  5\' 8"  (1.727 m)           Lorayne Bender, PA-C 01/14/20 0004    Daleen Bo, MD 01/22/20 1801

## 2020-01-13 NOTE — ED Notes (Addendum)
Provider made aware of pt increased agitation. Pt medicated per MAR,duplicate doses held per [employer] S. Joy,PA-C, [place] PD, and campus security at bedside. Pt updated on POC. Sitter at bedside. HR elevated with repeat VS. PA-C aware. NAD noted. Pt has already been wanded by security. Belongings no longer in possession, in hospital appropriate attire.
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
## 2020-01-13 NOTE — ED Triage Notes (Signed)
Pt oriented to person, place and the date, thought that today was Thursday, but knows that the date is 12/22,  Pt states that he knows mayors in [place] pt has flight of ideas, thoughts of grandeur.

## 2020-01-13 NOTE — ED Notes (Signed)
Pt wanded by security after changing into psych scrubs.  

## 2020-01-13 NOTE — ED Notes (Signed)
Placed pt's belongings in 2 separate lockers at this time Terry [name]

## 2020-01-14 DIAGNOSIS — Z046 Encounter for general psychiatric examination, requested by authority: Secondary | ICD-10-CM | POA: Diagnosis not present

## 2020-01-14 DIAGNOSIS — Z20822 Contact with and (suspected) exposure to covid-19: Secondary | ICD-10-CM | POA: Diagnosis not present

## 2020-01-14 DIAGNOSIS — J441 Chronic obstructive pulmonary disease with (acute) exacerbation: Secondary | ICD-10-CM | POA: Diagnosis not present

## 2020-01-14 DIAGNOSIS — F200 Paranoid schizophrenia: Secondary | ICD-10-CM | POA: Diagnosis not present

## 2020-01-14 LAB — URINALYSIS, ROUTINE W REFLEX MICROSCOPIC
Bilirubin Urine: NEGATIVE
Glucose, UA: 50 mg/dL — AB
Hgb urine dipstick: NEGATIVE
Ketones, ur: NEGATIVE mg/dL
Leukocytes,Ua: NEGATIVE
Nitrite: NEGATIVE
Protein, ur: NEGATIVE mg/dL
Specific Gravity, Urine: 1.028 (ref 1.005–1.030)
pH: 5 (ref 5.0–8.0)

## 2020-01-14 LAB — TROPONIN I (HIGH SENSITIVITY)
Troponin I (High Sensitivity): 3 ng/L (ref <18)
Troponin I (High Sensitivity): 3 ng/L (ref <18)

## 2020-01-14 LAB — RESP PANEL BY RT-PCR (FLU A&B, COVID) ARPGX2
Influenza A by PCR: NEGATIVE
Influenza B by PCR: NEGATIVE
SARS Coronavirus 2 by RT PCR: NEGATIVE

## 2020-01-14 NOTE — ED Notes (Signed)
Update given to daughter Kathi Ludwig [phone]

## 2020-01-14 NOTE — ED Notes (Signed)
Patient awake at this time. Patient wanting to know if he will be going home today. Stated to patient that would be up to the Dr.

## 2020-01-14 NOTE — ED Notes (Signed)
Patient is resting at this time. Patient is in no distress at this time. Patient has equal rise and fall of chest, equal respirations. Patient snoring.
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
## 2020-01-14 NOTE — Progress Notes (Signed)
Pt meets inpatient criteria per Harriett Sine, NP. Referral information has been faxed to the following hospitals for review:  [facility]        Disposition will continue to assist with inpatient placement needs.     Audree Camel, MSW, LCSW, [place] Clinical Social Worker II Disposition CSW [phone]

## 2020-01-14 NOTE — Progress Notes (Signed)
CSW spoke with Kibrionna I  admissions at [facility].   Pt has been unofficially accepted and will not officially accepted until the following is complete:  - EKG - COVID Test - Urinalysis (to check for any infections) - IVC paperwork   CSW spoke with Conni Elliot, RN and made her aware of the above information. She did reach out to the ED secretary and ask that IVC paperwork be faxed to disposition social worker. She took note of the above information.   Disposition social worker will continue to assist with inpatient placement needs.    Freddi Che, LCSW Clinical Social Worker - Disposition [phone]

## 2020-01-14 NOTE — Progress Notes (Signed)
Pt accepted to [facility] for [facility].  Dr. Genia Del is the accepting provider.    Call report to [phone].  Abby @ [facility] notified.     Pt is IVC.   Pt may be transported by [employer]   Pt scheduled  to arrive at [facility] after  [place], LCSW Clinical Social Worker - Disposition  [phone]

## 2020-01-14 NOTE — ED Provider Notes (Signed)
Emergency Medicine Observation Re-evaluation Note  Terry [name] is a 64 y.o. male, seen on rounds today.  Pt initially presented to the ED for complaints of Psychiatric Evaluation and Schizophrenia Currently, the patient is Sleeping. No distress   Physical Exam  BP 127/79 (BP Location: Right Arm)    Pulse 70    Temp 97.6 F (36.4 C) (Oral)    Resp 16    Ht 5\' 8"  (1.727 m)    Wt 101.2 kg    SpO2 99%    BMI 33.91 kg/m  Physical Exam General: Sleeping. Cardiac: RRR Lungs: Clear  Psych: not assessed  ED Course / MDM  EKG:  Clinical Course as of 01/14/20 0708  Wed Jan 13, 2020  1953 Home medication list reviewed.  Medication orders here in the ED updated to more closely reflect his prescribed home medications.  I also think additional medication is prudent due to his current presentation of agitation. [SJ]    Clinical Course User Index [SJ] Joy, Shawn C, PA-C   I have reviewed the labs performed to date as well as medications administered while in observation.  Recent changes in the last 24 hours include inpatient placement pending.  Plan  Current plan is for inpatient placement. Patient is under full IVC at this time.   Ezequiel Essex, MD 01/14/20 [phone]

## 2020-01-15 NOTE — ED Notes (Addendum)
Attempted to call report. Nurse states she cannot take report at this time

## 2020-05-05 ENCOUNTER — Encounter: Payer: Self-pay | Admitting: Internal Medicine

## 2020-06-29 ENCOUNTER — Ambulatory Visit: Payer: Medicare Other | Admitting: Internal Medicine

## 2020-10-05 ENCOUNTER — Other Ambulatory Visit: Payer: Self-pay | Admitting: General Surgery

## 2021-04-18 ENCOUNTER — Encounter: Payer: Self-pay | Admitting: *Deleted

## 2021-04-20 ENCOUNTER — Emergency Department (HOSPITAL_COMMUNITY)
Admission: EM | Admit: 2021-04-20 | Discharge: 2021-04-20 | Disposition: A | Discharge status: Home / Self Care | Payer: Medicare Other | Attending: Emergency Medicine | Admitting: Emergency Medicine

## 2021-04-20 ENCOUNTER — Emergency Department (HOSPITAL_COMMUNITY): Payer: Medicare Other

## 2021-04-20 ENCOUNTER — Encounter (HOSPITAL_COMMUNITY): Payer: Self-pay

## 2021-04-20 ENCOUNTER — Other Ambulatory Visit: Payer: Self-pay

## 2021-04-20 DIAGNOSIS — R600 Localized edema: Secondary | ICD-10-CM | POA: Insufficient documentation

## 2021-04-20 DIAGNOSIS — M7121 Synovial cyst of popliteal space [Baker], right knee: Secondary | ICD-10-CM | POA: Diagnosis not present

## 2021-04-20 DIAGNOSIS — M7989 Other specified soft tissue disorders: Secondary | ICD-10-CM | POA: Diagnosis present

## 2021-04-20 DIAGNOSIS — M25561 Pain in right knee: Secondary | ICD-10-CM

## 2021-04-20 LAB — COMPREHENSIVE METABOLIC PANEL
ALT: 24 U/L (ref 0–44)
AST: 21 U/L (ref 15–41)
Albumin: 3.7 g/dL (ref 3.5–5.0)
Alkaline Phosphatase: 116 U/L (ref 38–126)
Anion gap: 11 (ref 5–15)
BUN: 12 mg/dL (ref 8–23)
CO2: 25 mmol/L (ref 22–32)
Calcium: 9 mg/dL (ref 8.9–10.3)
Chloride: 100 mmol/L (ref 98–111)
Creatinine, Ser: 1.18 mg/dL (ref 0.61–1.24)
GFR, Estimated: >60 mL/min (ref >60)
Glucose, Bld: 106 mg/dL — ABNORMAL HIGH (ref 70–99)
Potassium: 4.1 mmol/L (ref 3.5–5.1)
Sodium: 136 mmol/L (ref 135–145)
Total Bilirubin: 0.7 mg/dL (ref 0.3–1.2)
Total Protein: 8.4 g/dL — ABNORMAL HIGH (ref 6.5–8.1)

## 2021-04-20 LAB — CBC WITH DIFFERENTIAL/PLATELET
Abs Immature Granulocytes: 0.01 10*3/uL (ref 0.00–0.07)
Basophils Absolute: 0 10*3/uL (ref 0.0–0.1)
Basophils Relative: 0 %
Eosinophils Absolute: 0.2 10*3/uL (ref 0.0–0.5)
Eosinophils Relative: 2 %
HCT: 41.4 % (ref 39.0–52.0)
Hemoglobin: 12.7 g/dL — ABNORMAL LOW (ref 13.0–17.0)
Immature Granulocytes: 0 %
Lymphocytes Relative: 26 %
Lymphs Abs: 2 10*3/uL (ref 0.7–4.0)
MCH: 25.3 pg — ABNORMAL LOW (ref 26.0–34.0)
MCHC: 30.7 g/dL (ref 30.0–36.0)
MCV: 82.6 fL (ref 80.0–100.0)
Monocytes Absolute: 0.5 10*3/uL (ref 0.1–1.0)
Monocytes Relative: 6 %
Neutro Abs: 4.9 10*3/uL (ref 1.7–7.7)
Neutrophils Relative %: 66 %
Platelets: 229 10*3/uL (ref 150–400)
RBC: 5.01 MIL/uL (ref 4.22–5.81)
RDW: 16.1 % — ABNORMAL HIGH (ref 11.5–15.5)
WBC: 7.5 10*3/uL (ref 4.0–10.5)
nRBC: 0 % (ref 0.0–0.2)

## 2021-04-20 LAB — BRAIN NATRIURETIC PEPTIDE: B Natriuretic Peptide: 28 pg/mL (ref 0.0–100.0)

## 2021-04-20 NOTE — Discharge Instructions (Signed)
Please follow up with your PCP for further evaluation of your knee pain. The ultrasound showed possibility of a Baker's Cyst - see attached for more information.  ? ?It is recommended that you rest, ice, and elevate your leg to help reduce pain/inflammation. Take Ibuprofen and Tylenol as needed for pain.  ? ?Return to the ED for any new/worsening symptoms ?

## 2021-04-20 NOTE — ED Triage Notes (Signed)
Pt reports swelling to R leg x 4 days.  BLE edema noted.  Denies hx of CHF.  Reports takes no prescriptions medications daily.  Denies sob or cp.  Ambulatory with steady gait.  Reports R knee pain also.  ?

## 2021-04-20 NOTE — ED Provider Notes (Signed)
?[place] ?Provider Note ? ? ?CSN: [id number] ?Arrival date & time: 04/20/21  1154 ? ?  ? ?History ? ?Chief Complaint  ?Patient presents with  ? Leg Swelling  ? ? ?Terry [name] is a 66 y.o. male who presents to the ED today with complaint of gradual onset, constant, worsening R leg swelling that began about 1 week ago. Pt also complains of an aching sensation in his R knee. He believes he has arthritis in the knee however is unsure. Denies any recent trauma or falls. Denies any chest pain or SOB. No other complaints at this time.  ? ?The history is provided by the patient and medical records.  ? ?  ? ?Home Medications ?Prior to Admission medications   ?Medication Sig Start Date End Date Taking? Authorizing Provider  ?albuterol (PROVENTIL HFA;VENTOLIN HFA) 108 (90 BASE) MCG/ACT inhaler Inhale 2 puffs into the lungs every 4 (four) hours as needed for wheezing or shortness of breath. ?Patient not taking: Reported on 01/13/2020 12/18/12   Ward, Delice Bison, DO  ?benztropine (COGENTIN) 1 MG tablet Take 1 tablet (1 mg total) by mouth at bedtime. ?Patient not taking: Reported on 04/20/2021 09/15/19   Cloria Spring, MD  ?clonazePAM (KLONOPIN) 1 MG tablet Take 1 tablet (1 mg total) by mouth 3 (three) times daily. ?Patient not taking: Reported on 04/20/2021 09/15/19 09/14/20  Cloria Spring, MD  ?ziprasidone (GEODON) 80 MG capsule Take 1 capsule (80 mg total) by mouth at bedtime. ?Patient not taking: Reported on 04/20/2021 09/15/19   Cloria Spring, MD  ?   ? ?Allergies    ?Patient has no known allergies.   ? ?Review of Systems   ?Review of Systems  ?Constitutional:  Negative for chills and fever.  ?Respiratory:  Negative for shortness of breath.   ?Cardiovascular:  Positive for leg swelling (RLE). Negative for chest pain.  ?Musculoskeletal:  Positive for arthralgias.  ?All other systems reviewed and are negative. ? ?Physical Exam ?Updated Vital Signs ?BP (!) 160/104   Pulse 84   Temp 97.7 ?F (36.5 ?C)  (Oral)   Resp 18   Ht '5\' 8"'$  (1.727 m)   Wt (!) 138.3 kg   SpO2 100%   BMI 46.38 kg/m?  ?Physical Exam ?Vitals and nursing note reviewed.  ?Constitutional:   ?   Appearance: He is obese. He is not ill-appearing.  ?HENT:  ?   Head: Normocephalic and atraumatic.  ?Eyes:  ?   Conjunctiva/sclera: Conjunctivae normal.  ?Cardiovascular:  ?   Rate and Rhythm: Normal rate and regular rhythm.  ?Pulmonary:  ?   Effort: Pulmonary effort is normal.  ?   Breath sounds: Normal breath sounds. No wheezing, rhonchi or rales.  ?Abdominal:  ?   Palpations: Abdomen is soft.  ?   Tenderness: There is no abdominal tenderness.  ?Musculoskeletal:  ?   Cervical back: Neck supple.  ?   Right lower leg: Edema present.  ?   Left lower leg: Edema present.  ?   Comments: 1+ pitting edema bilaterally. RLE does not appear more swollen compared to LLE. No overlying skin changes including erythema or increased warmth. No specific calf TTP.  ? ?+ TTP to anterior aspect of R knee along inferiormedial joint line. ROM intact however pt reports pain with same. Negative anterior and posterior drawer test. No varus or valgus laxity. 2+ DP pulse.   ?Skin: ?   General: Skin is warm and dry.  ?Neurological:  ?  Mental Status: He is alert.  ? ? ?ED Results / Procedures / Treatments   ?Labs ?(all labs ordered are listed, but only abnormal results are displayed) ?Labs Reviewed  ?CBC WITH DIFFERENTIAL/PLATELET - Abnormal; Notable for the following components:  ?    Result Value  ? Hemoglobin 12.7 (*)   ? MCH 25.3 (*)   ? RDW 16.1 (*)   ? All other components within normal limits  ?COMPREHENSIVE METABOLIC PANEL - Abnormal; Notable for the following components:  ? Glucose, Bld 106 (*)   ? Total Protein 8.4 (*)   ? All other components within normal limits  ?BRAIN NATRIURETIC PEPTIDE  ? ? ?EKG ?None ? ?Radiology ?US Venous Img Lower Right (DVT Study) ? ?Result Date: 04/20/2021 ?CLINICAL DATA:  Acute right lower extremity swelling. EXAM: Right LOWER EXTREMITY  VENOUS DOPPLER ULTRASOUND TECHNIQUE: Gray-scale sonography with compression, as well as color and duplex ultrasound, were performed to evaluate the deep venous system(s) from the level of the common femoral vein through the popliteal and proximal calf veins. COMPARISON:  None. FINDINGS: VENOUS Normal compressibility of the common femoral, superficial femoral, and popliteal veins, as well as the visualized calf veins. Visualized portions of profunda femoral vein and great saphenous vein unremarkable. No filling defects to suggest DVT on grayscale or color Doppler imaging. Doppler waveforms show normal direction of venous flow, normal respiratory plasticity and response to augmentation. Limited views of the contralateral common femoral vein are unremarkable. OTHER Probable Baker's cyst seen in right popliteal fossa. Limitations: none IMPRESSION: No definite evidence of deep venous thrombosis seen in right lower extremity. Probable Baker's cyst seen in right popliteal fossa. Electronically Signed   By: Marijo Conception M.D.   On: 04/20/2021 14:22  ? ?DG Chest Port 1 View ? ?Result Date: 04/20/2021 ?CLINICAL DATA:  Lower extremity swelling. EXAM: PORTABLE CHEST 1 VIEW COMPARISON:  05/28/2016 FINDINGS: Stable cardiomediastinal contours. There is no pleural effusion or edema. No airspace densities identified. The visualized osseous structures are unremarkable. IMPRESSION: No acute cardiopulmonary abnormalities. Electronically Signed   By: Kerby Moors M.D.   On: 04/20/2021 14:49  ? ?DG Knee Complete 4 Views Right ? ?Result Date: 04/20/2021 ?CLINICAL DATA:  Knee pain and swelling for 4 days. EXAM: RIGHT KNEE - COMPLETE 4+ VIEW COMPARISON:  None. FINDINGS: No signs of acute fracture or dislocation. Mild to moderate tricompartment osteoarthritis noted. Tiny suprapatellar joint effusion. Diffuse subcutaneous edema is identified within the soft tissues of the right lower extremity. IMPRESSION: 1. No acute findings. 2.  Osteoarthritis. 3. Subcutaneous edema. Electronically Signed   By: Kerby Moors M.D.   On: 04/20/2021 14:46   ? ?Procedures ?Procedures  ? ? ?Medications Ordered in ED ?Medications - No data to display ? ?ED Course/ Medical Decision Making/ A&P ?  ?                        ?Medical Decision Making ?66 year old male who presents to the ED today with complaint of right lower extremity swelling x1 week with knee pain.  Denies any recent injury to same.  He is noted to have bilateral lower extremity swelling on arrival.  No history of CHF and denies any chest pain or shortness of breath.  Vitals are stable on arrival.  I have preemptively ordered a right lower extremity DVT study and complaint of unilateral leg swelling however again appears that both legs are swollen.  Patient states that this is normal for him.  He denies any chest pain or shortness of breath.  He is noted to have some tenderness palpation to the anterior aspect of the right knee however neurovascularly intact.  We will plan for x-ray of same.  Patient is quite heavy and I suspect that he may have some bone-on-bone causing discomfort in his knee.  We will plan for chest x-ray as well to assess for fluid overload and obtain labs in case patient does not of having DVT and will require anticoagulation.  Patient in agreement with plan.  We will continue to monitor.  ? ?Work-up overall reassuring.  X-ray with osteoarthritis of the knee as well as concern for Baker's cyst DVT study.  Remainder of work-up unremarkable.  Ace wrap provided for knee.  Patient instructed on RICE therapy.  Advised to follow-up with his PCP for further eval.  He is in agreement with plan and stable for discharge. ? ?Problems Addressed: ?Acute pain of right knee: acute illness or injury ? ?Amount and/or Complexity of Data Reviewed ?Labs: ordered. ?   Details: CBC without leukocytosis. Hgb stable at 12.7 ?CMP with glucose 106. No other electrolyte abnormalities.  ?BNP  28.0 ?Radiology: ordered. ?   Details: Ultrasound: ?IMPRESSION:  ?No definite evidence of deep venous thrombosis seen in right lower  ?extremity. Probable Baker's cyst seen in right popliteal fossa.  ? ?Xrays of knee and chest viewed

## 2021-05-17 ENCOUNTER — Ambulatory Visit: Payer: Medicare Other | Admitting: Orthopedic Surgery

## 2021-05-24 ENCOUNTER — Ambulatory Visit (INDEPENDENT_AMBULATORY_CARE_PROVIDER_SITE_OTHER): Payer: 59 | Admitting: Orthopedic Surgery

## 2021-05-24 ENCOUNTER — Ambulatory Visit (INDEPENDENT_AMBULATORY_CARE_PROVIDER_SITE_OTHER): Payer: 59

## 2021-05-24 ENCOUNTER — Encounter: Payer: Self-pay | Admitting: Orthopedic Surgery

## 2021-05-24 VITALS — BP 129/79 | HR 89 | Ht 68.0 in | Wt 298.0 lb

## 2021-05-24 DIAGNOSIS — M1711 Unilateral primary osteoarthritis, right knee: Secondary | ICD-10-CM

## 2021-05-24 DIAGNOSIS — Z6841 Body Mass Index (BMI) 40.0 and over, adult: Secondary | ICD-10-CM

## 2021-05-24 DIAGNOSIS — M25561 Pain in right knee: Secondary | ICD-10-CM

## 2021-05-24 NOTE — Patient Instructions (Signed)
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
## 2021-06-15 ENCOUNTER — Telehealth: Payer: Self-pay

## 2021-06-15 NOTE — Telephone Encounter (Signed)
Patient called stating that his knee has not improved at all since injection. I asked if he had used ice as instructed and he told me he wasn't told that. I suggested him to try using ice and maybe try rubbing Aspercreme with Lidocaine where he is hurting. He said he would and if no relief he would call us back.

## 2021-06-20 ENCOUNTER — Ambulatory Visit: Payer: Medicare Other

## 2021-07-09 ENCOUNTER — Emergency Department (HOSPITAL_COMMUNITY)
Admission: EM | Admit: 2021-07-09 | Discharge: 2021-07-09 | Disposition: A | Discharge status: Home / Self Care | Payer: 59 | Attending: Emergency Medicine | Admitting: Emergency Medicine

## 2021-07-09 ENCOUNTER — Encounter (HOSPITAL_COMMUNITY): Payer: Self-pay

## 2021-07-09 ENCOUNTER — Other Ambulatory Visit: Payer: Self-pay

## 2021-07-09 DIAGNOSIS — J449 Chronic obstructive pulmonary disease, unspecified: Secondary | ICD-10-CM | POA: Diagnosis not present

## 2021-07-09 DIAGNOSIS — H5461 Unqualified visual loss, right eye, normal vision left eye: Secondary | ICD-10-CM | POA: Diagnosis not present

## 2021-07-09 DIAGNOSIS — T783XXA Angioneurotic edema, initial encounter: Secondary | ICD-10-CM | POA: Diagnosis not present

## 2021-07-09 DIAGNOSIS — T7840XA Allergy, unspecified, initial encounter: Secondary | ICD-10-CM | POA: Diagnosis present

## 2021-07-09 LAB — CBC
HCT: 42 % (ref 39.0–52.0)
Hemoglobin: 13.3 g/dL (ref 13.0–17.0)
MCH: 25.4 pg — ABNORMAL LOW (ref 26.0–34.0)
MCHC: 31.7 g/dL (ref 30.0–36.0)
MCV: 80.2 fL (ref 80.0–100.0)
Platelets: 218 10*3/uL (ref 150–400)
RBC: 5.24 MIL/uL (ref 4.22–5.81)
RDW: 16.3 % — ABNORMAL HIGH (ref 11.5–15.5)
WBC: 6.1 10*3/uL (ref 4.0–10.5)
nRBC: 0 % (ref 0.0–0.2)

## 2021-07-09 LAB — BASIC METABOLIC PANEL
Anion gap: 6 (ref 5–15)
BUN: 11 mg/dL (ref 8–23)
CO2: 26 mmol/L (ref 22–32)
Calcium: 8.8 mg/dL — ABNORMAL LOW (ref 8.9–10.3)
Chloride: 105 mmol/L (ref 98–111)
Creatinine, Ser: 1.06 mg/dL (ref 0.61–1.24)
GFR, Estimated: >60 mL/min (ref >60)
Glucose, Bld: 118 mg/dL — ABNORMAL HIGH (ref 70–99)
Potassium: 4.1 mmol/L (ref 3.5–5.1)
Sodium: 137 mmol/L (ref 135–145)

## 2021-07-09 MED ORDER — METHYLPREDNISOLONE SODIUM SUCC 125 MG IJ SOLR
125.0000 mg | Freq: Once | INTRAMUSCULAR | Status: AC
  Administered 2021-07-09: 125 mg via INTRAVENOUS
  Filled 2021-07-09: qty 2

## 2021-07-09 MED ORDER — FAMOTIDINE IN NACL 20-0.9 MG/50ML-% IV SOLN
20.0000 mg | Freq: Once | INTRAVENOUS | Status: AC
  Administered 2021-07-09: 20 mg via INTRAVENOUS
  Filled 2021-07-09: qty 50

## 2021-07-09 MED ORDER — PREDNISONE 50 MG PO TABS: 50.0000 mg | ORAL_TABLET | Freq: Every day | ORAL | 0 refills | Status: AC

## 2021-07-09 MED ORDER — DIPHENHYDRAMINE HCL 50 MG/ML IJ SOLN
25.0000 mg | Freq: Once | INTRAMUSCULAR | Status: AC
  Administered 2021-07-09: 25 mg via INTRAVENOUS
  Filled 2021-07-09: qty 1

## 2021-07-09 NOTE — ED Triage Notes (Signed)
Pt to the ED with complaints of an allergic reaction that began this morning around 0850.  NADN

## 2021-07-09 NOTE — ED Provider Notes (Signed)
[facility] EMERGENCY DEPARTMENT Provider Note   CSN: [id number] Arrival date & time: 07/09/21  1029     History  Chief Complaint  Patient presents with   Allergic Reaction    Swollen tongue x 2 hours    Terry [name] is a 66 y.o. male.  Pt is a 66 yo male with a pmhx significant for schizophrenia, copd, and blindness/amblyopia of the right eye.  Pt said he took lasix for the first time yesterday and woke up this am with his tongue swollen.  He has no other sx.  He did not take any other meds pta.  He has never been on lisinopril.       Home Medications Prior to Admission medications   Medication Sig Start Date End Date Taking? Authorizing Provider  ABILIFY MAINTENA 400 MG PRSY prefilled syringe Inject 400 mg into the muscle every 28 (twenty-eight) days. 05/09/21  Yes [provider]  clonazePAM (KLONOPIN) 1 MG tablet Take 1 tablet (1 mg total) by mouth 3 (three) times daily. 09/15/19 07/09/21 Yes Cloria Spring, MD  ibuprofen (ADVIL) 800 MG tablet Take by mouth. 07/07/21  Yes [provider]  predniSONE (DELTASONE) 50 MG tablet Take 1 tablet (50 mg total) by mouth daily with breakfast. 07/09/21  Yes Isla Pence, MD  tiZANidine (ZANAFLEX) 2 MG tablet Take 2 mg by mouth 2 (two) times daily as needed. 07/07/21   [provider]      Allergies    Lasix [furosemide]    Review of Systems   Review of Systems  HENT:         Tongue swelling  All other systems reviewed and are negative.   Physical Exam Updated Vital Signs BP 122/77   Pulse 64   Temp 98 F (36.7 C) (Oral)   Resp 19   Ht '5\' 6"'$  (1.676 m)   Wt (!) 138.3 kg   SpO2 99%   BMI 49.23 kg/m  Physical Exam Vitals and nursing note reviewed.  Constitutional:      Appearance: He is obese.  HENT:     Head: Normocephalic and atraumatic.     Right Ear: External ear normal.     Left Ear: External ear normal.     Nose: Nose normal.     Mouth/Throat:     Mouth: Mucous membranes are  moist.     Comments: Tongue is swollen.  Lips are not swollen.  No swelling in posterior pharynx. Eyes:     Comments: Right eye blind  Right eye amblyopia  Cardiovascular:     Rate and Rhythm: Normal rate and regular rhythm.     Pulses: Normal pulses.     Heart sounds: Normal heart sounds.  Pulmonary:     Effort: Pulmonary effort is normal.     Breath sounds: Normal breath sounds.  Abdominal:     General: Abdomen is flat. Bowel sounds are normal.     Palpations: Abdomen is soft.  Musculoskeletal:        General: Normal range of motion.     Cervical back: Normal range of motion and neck supple.  Skin:    General: Skin is warm.     Capillary Refill: Capillary refill takes less than 2 seconds.  Neurological:     General: No focal deficit present.     Mental Status: He is alert and oriented to person, place, and time.  Psychiatric:        Mood and Affect: Mood normal.  Behavior: Behavior normal.     ED Results / Procedures / Treatments   Labs (all labs ordered are listed, but only abnormal results are displayed) Labs Reviewed  CBC - Abnormal; Notable for the following components:      Result Value   MCH 25.4 (*)    RDW 16.3 (*)    All other components within normal limits  BASIC METABOLIC PANEL - Abnormal; Notable for the following components:   Glucose, Bld 118 (*)    Calcium 8.8 (*)    All other components within normal limits    EKG None  Radiology No results found.  Procedures Procedures    Medications Ordered in ED Medications  diphenhydrAMINE (BENADRYL) injection 25 mg (25 mg Intravenous Given 07/09/21 1110)  methylPREDNISolone sodium succinate (SOLU-MEDROL) 125 mg/2 mL injection 125 mg (125 mg Intravenous Given 07/09/21 1111)  famotidine (PEPCID) IVPB 20 mg premix (0 mg Intravenous Stopped 07/09/21 1141)    ED Course/ Medical Decision Making/ A&P                           Medical Decision Making Amount and/or Complexity of Data Reviewed Labs:  ordered.  Risk Prescription drug management.   This patient presents to the ED for concern of allergic reaction, this involves an extensive number of treatment options, and is a complaint that carries with it a high risk of complications and morbidity.  The differential diagnosis includes angioedema, allergies, anaphylaxis   Co morbidities that complicate the patient evaluation  schizophrenia, copd, and blindness/amblyopia of the right eye   Additional history obtained:  Additional history obtained from epic chart review    Lab Tests:  I Ordered, and personally interpreted labs.  The pertinent results include:  cbc nl, bmp nl  Cardiac Monitoring:  The patient was maintained on a cardiac monitor.  I personally viewed and interpreted the cardiac monitored which showed an underlying rhythm of: nsr   Medicines ordered and prescription drug management:  I ordered medication including solumedrol, benadryl, and pepcid  for angioedema  Reevaluation of the patient after these medicines showed that the patient resolved I have reviewed the patients home medicines and have made adjustments as needed  Critical Interventions:  meds   Problem List / ED Course:  Angioedema:  pt's tongue is back to normal after meds.  He was observed for several hrs and did not have any worsening of sx.  He is able to swallow and drink.  This started after taking lasix for the first time.  He is told to stop the lasix .  I have listed lasix as an allergy.  Pt is d/c with  prednisone.  He is to return if worse.  F/u with pcp.   Reevaluation:  After the interventions noted above, I reevaluated the patient and found that they have :improved   Social Determinants of Health:  Lives at home   Dispostion:  After consideration of the diagnostic results and the patients response to treatment, I feel that the patent would benefit from discharge with outpatient f/u.          Final Clinical  Impression(s) / ED Diagnoses Final diagnoses:  Angioedema, initial encounter    Rx / DC Orders ED Discharge Orders          Ordered    predniSONE (DELTASONE) 50 MG tablet  Daily with breakfast        07/09/21 1349  Isla Pence, MD 07/09/21 1423

## 2021-07-09 NOTE — Discharge Instructions (Addendum)
Stop Lasix 

## 2023-01-23 ENCOUNTER — Encounter (HOSPITAL_COMMUNITY): Payer: Self-pay

## 2023-01-23 ENCOUNTER — Other Ambulatory Visit: Payer: Self-pay

## 2023-01-23 ENCOUNTER — Emergency Department (HOSPITAL_COMMUNITY)
Admission: EM | Admit: 2023-01-23 | Discharge: 2023-01-23 | Disposition: A | Discharge status: Home / Self Care | Payer: 59 | Attending: Emergency Medicine | Admitting: Emergency Medicine

## 2023-01-23 DIAGNOSIS — J449 Chronic obstructive pulmonary disease, unspecified: Secondary | ICD-10-CM | POA: Insufficient documentation

## 2023-01-23 DIAGNOSIS — T7840XA Allergy, unspecified, initial encounter: Secondary | ICD-10-CM | POA: Diagnosis present

## 2023-01-23 MED ORDER — FAMOTIDINE 20 MG PO TABS
20.0000 mg | ORAL_TABLET | Freq: Once | ORAL | Status: AC
  Administered 2023-01-23: 20 mg via ORAL
  Filled 2023-01-23: qty 1

## 2023-01-23 MED ORDER — EPINEPHRINE 0.3 MG/0.3ML IJ SOAJ: 0.3000 mg | INTRAMUSCULAR | 0 refills | Status: AC | PRN

## 2023-01-23 MED ORDER — DIPHENHYDRAMINE HCL 25 MG PO CAPS
50.0000 mg | ORAL_CAPSULE | Freq: Once | ORAL | Status: AC
Start: 2023-01-23 — End: 2023-01-23
  Administered 2023-01-23: 50 mg via ORAL
  Filled 2023-01-23: qty 2

## 2023-01-23 NOTE — ED Notes (Signed)
 Pt stated he was ready to go home and he felt better, he stated he would waited as long as he could but he had to leave, pt stated he would return if he started to feel worse.

## 2023-01-23 NOTE — ED Provider Notes (Signed)
 [place] EMERGENCY DEPARTMENT AT [facility] Provider Note   CSN: [id number] Arrival date & time: 01/23/23  1438     History  Chief Complaint  Patient presents with   Allergic Reaction    Terry [name] is a 68 y.o. male.  With a history of COPD who presents to the ED for suspected allergic reaction.  Patient ate a meal containing peanut butter around noon today.  He then took a nap and awoke a couple hours later with swelling of his tongue.  Swelling most pronounced over the left side of his tongue.  Does not feel short of breath.  No abdominal pain nausea or vomiting.  No rashes.  Has a prior history of tongue swelling he thinks may be associated with exposure to peanut products.  No history of severe allergic reactions or anaphylaxis.   Allergic Reaction      Home Medications Prior to Admission medications   Medication Sig Start Date End Date Taking? Authorizing Provider  EPINEPHrine  0.3 mg/0.3 mL IJ SOAJ injection Inject 0.3 mg into the muscle as needed for anaphylaxis. 01/23/23  Yes Pamella Sharper A, DO  ABILIFY MAINTENA 400 MG PRSY prefilled syringe Inject 400 mg into the muscle every 28 (twenty-eight) days. 05/09/21   [provider]  clonazePAM  (KLONOPIN ) 1 MG tablet Take 1 tablet (1 mg total) by mouth 3 (three) times daily. 09/15/19 07/09/21  Okey Barnie SAUNDERS, MD  ibuprofen (ADVIL) 800 MG tablet Take by mouth. 07/07/21   [provider]  predniSONE  (DELTASONE ) 50 MG tablet Take 1 tablet (50 mg total) by mouth daily with breakfast. 07/09/21   Haviland, Julie, MD  tiZANidine (ZANAFLEX) 2 MG tablet Take 2 mg by mouth 2 (two) times daily as needed. 07/07/21   [provider]      Allergies    Lasix [furosemide]    Review of Systems   Review of Systems  Physical Exam Updated Vital Signs BP (!) 138/93 (BP Location: Right Arm)   Pulse 76   Temp 98 F (36.7 C) (Oral)   Resp 18   Ht 5' 7 (1.702 m)   Wt 134.7 kg   SpO2 100%   BMI 46.52  kg/m  Physical Exam Vitals and nursing note reviewed.  HENT:     Head: Normocephalic and atraumatic.     Mouth/Throat:     Comments: Left-sided tongue swelling Uvula midline No airway edema Eyes:     Pupils: Pupils are equal, round, and reactive to light.  Cardiovascular:     Rate and Rhythm: Normal rate and regular rhythm.  Pulmonary:     Effort: Pulmonary effort is normal.     Breath sounds: Normal breath sounds.  Abdominal:     Palpations: Abdomen is soft.     Tenderness: There is no abdominal tenderness.  Skin:    General: Skin is warm and dry.     Findings: No rash.  Neurological:     Mental Status: He is alert.  Psychiatric:        Mood and Affect: Mood normal.     ED Results / Procedures / Treatments   Labs (all labs ordered are listed, but only abnormal results are displayed) Labs Reviewed - No data to display  EKG None  Radiology No results found.  Procedures Procedures    Medications Ordered in ED Medications  diphenhydrAMINE  (BENADRYL ) capsule 50 mg (50 mg Oral Given 01/23/23 1510)  famotidine  (PEPCID ) tablet 20 mg (20 mg Oral Given 01/23/23  [id number])    ED Course/ Medical Decision Making/ A&P Clinical Course as of 01/23/23 TR[place] Jan 23, 2023  1820 Patient has remained stable here with decreased tongue swelling.  No airway involvement.  He left before formal discharge.  I will call in a prescription for an EpiPen  to his pharmacy in the event of future allergic/anaphylactic reactions. [MP]    Clinical Course User Index [MP] Pamella Ozell LABOR, DO                                 Medical Decision Making 68 year old male presenting for suspected allergic reaction to peanuts.  Time of ingestion around noon.  Awoke couple hours later with tongue swelling.  No other symptoms.  Airway intact.  Still has some left-sided tongue swelling.  Will give Benadryl  and famotidine  and continue to monitor to ascertain need for epinephrine .  No other identifiable  triggers  Risk Prescription drug management.           Final Clinical Impression(s) / ED Diagnoses Final diagnoses:  Allergic reaction, initial encounter    Rx / DC Orders ED Discharge Orders          Ordered    EPINEPHrine  0.3 mg/0.3 mL IJ SOAJ injection  As needed        01/23/23 1822              Pamella Ozell LABOR, DO 01/23/23 1823

## 2023-01-23 NOTE — Discharge Instructions (Addendum)
 You were seen in the emerged part for an allergic reaction This is most likely due to peanuts We have called in a prescription for an EpiPen  for you to pick up your pharmacy and take as directed in the event of a future reaction Remember that it is important to come directly to the ED or call 911 if you need to use your EpiPen  at home Stay away from peanuts Follow-up with your primary care doctor Return for any symptoms of allergic reaction in future

## 2023-01-23 NOTE — ED Triage Notes (Addendum)
 Pt reports he ate a peanut butter and syrup sandwich and laid down for 30 minutes and woke up with a swollen tongue.  Pt reports this happened about a year ago but he never figured out what caused it.

## 2023-03-01 DIAGNOSIS — Z0001 Encounter for general adult medical examination with abnormal findings: Secondary | ICD-10-CM | POA: Diagnosis not present

## 2023-03-01 DIAGNOSIS — M1711 Unilateral primary osteoarthritis, right knee: Secondary | ICD-10-CM | POA: Diagnosis not present

## 2023-03-01 DIAGNOSIS — Z1389 Encounter for screening for other disorder: Secondary | ICD-10-CM | POA: Diagnosis not present

## 2023-03-01 DIAGNOSIS — J449 Chronic obstructive pulmonary disease, unspecified: Secondary | ICD-10-CM | POA: Diagnosis not present

## 2023-03-06 ENCOUNTER — Encounter (INDEPENDENT_AMBULATORY_CARE_PROVIDER_SITE_OTHER): Payer: Self-pay | Admitting: *Deleted

## 2023-03-13 ENCOUNTER — Encounter: Payer: Self-pay | Admitting: Gastroenterology

## 2023-03-13 ENCOUNTER — Ambulatory Visit: Payer: 59 | Admitting: Gastroenterology
